# Patient Record
Sex: Female | Born: 1990 | Race: Black or African American | Hispanic: No | Marital: Single | Smoking: Never smoker
Health system: Southern US, Community
[De-identification: ages and names within clinical notes are randomized; demographics above are authoritative.]

## PROBLEM LIST (undated history)

## (undated) ENCOUNTER — Inpatient Hospital Stay (HOSPITAL_COMMUNITY): Payer: Self-pay

## (undated) DIAGNOSIS — A749 Chlamydial infection, unspecified: Secondary | ICD-10-CM

## (undated) DIAGNOSIS — D649 Anemia, unspecified: Secondary | ICD-10-CM

## (undated) DIAGNOSIS — IMO0002 Reserved for concepts with insufficient information to code with codable children: Secondary | ICD-10-CM

## (undated) HISTORY — DX: Anemia, unspecified: D64.9

## (undated) HISTORY — DX: Chlamydial infection, unspecified: A74.9

## (undated) HISTORY — PX: LEEP: SHX91

## (undated) HISTORY — DX: Reserved for concepts with insufficient information to code with codable children: IMO0002

---
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
## 2008-05-08 DIAGNOSIS — IMO0002 Reserved for concepts with insufficient information to code with codable children: Secondary | ICD-10-CM

## 2008-05-08 DIAGNOSIS — R87619 Unspecified abnormal cytological findings in specimens from cervix uteri: Secondary | ICD-10-CM

## 2008-05-08 HISTORY — DX: Unspecified abnormal cytological findings in specimens from cervix uteri: R87.619

## 2008-05-08 HISTORY — DX: Reserved for concepts with insufficient information to code with codable children: IMO0002

## 2011-01-02 LAB — ABO/RH: RH Type: POSITIVE

## 2011-01-02 LAB — VARICELLA ZOSTER ANTIBODY, IGG: Varicella: IMMUNE

## 2011-01-02 LAB — CBC
HCT: 38 % (ref 36–46)
Hemoglobin: 11.9 g/dL — AB (ref 12.0–16.0)

## 2011-01-02 LAB — GC/CHLAMYDIA PROBE AMP, GENITAL: Chlamydia: NEGATIVE

## 2011-05-09 NOTE — L&D Delivery Note (Cosign Needed)
Delivery Note At 8:15 PM a viable female was delivered via Vaginal, Spontaneous Delivery (Presentation:OA ;  ).  APGAR: 8, 9; weight .  Not able to obtain. Nursing staff to enter in chart 1hr after birth Placenta status: Intact, Spontaneous.  Cord: 3 vessels with the following complications: Long, Hyperspiraled.  Cord pH: NA.   Nuchal cord x2. Easily reducible  Anesthesia: Epidural  Episiotomy: None Lacerations: None Est. Blood Loss (mL): 400  Mom to postpartum.  Baby to nursery-stable.  Areej Tayler 08/22/2011, 8:44 PM

## 2011-05-15 LAB — GLUCOSE TOLERANCE, 1 HOUR: GTT, 1 hr: 114

## 2011-05-17 LAB — GC/CHLAMYDIA PROBE AMP, GENITAL
Chlamydia: NEGATIVE
Gonorrhea: NEGATIVE

## 2011-06-15 DIAGNOSIS — Z9889 Other specified postprocedural states: Secondary | ICD-10-CM

## 2011-06-15 DIAGNOSIS — O3440 Maternal care for other abnormalities of cervix, unspecified trimester: Secondary | ICD-10-CM

## 2011-06-15 NOTE — Progress Notes (Signed)
Chart abstracted- per chart patient had psychosocial screening  01/02/11, and depression screening 05/25/11 showing patient reports feeling depressed  With little pleasure in doing things, also had nutrition screening, needs pap repeated due to insufficient specimen

## 2011-06-19 ENCOUNTER — Other Ambulatory Visit (HOSPITAL_COMMUNITY)
Admission: RE | Admit: 2011-06-19 | Discharge: 2011-06-19 | Disposition: A | Discharge status: Home / Self Care | Payer: Medicaid Other | Source: Ambulatory Visit | Attending: Obstetrics and Gynecology | Admitting: Obstetrics and Gynecology

## 2011-06-19 ENCOUNTER — Ambulatory Visit (INDEPENDENT_AMBULATORY_CARE_PROVIDER_SITE_OTHER): Payer: Medicaid Other | Admitting: Obstetrics and Gynecology

## 2011-06-19 DIAGNOSIS — O09899 Supervision of other high risk pregnancies, unspecified trimester: Secondary | ICD-10-CM

## 2011-06-19 DIAGNOSIS — Z01419 Encounter for gynecological examination (general) (routine) without abnormal findings: Secondary | ICD-10-CM | POA: Insufficient documentation

## 2011-06-19 DIAGNOSIS — Z3480 Encounter for supervision of other normal pregnancy, unspecified trimester: Secondary | ICD-10-CM

## 2011-06-19 LAB — CBC
MCH: 27.1 pg (ref 26.0–34.0)
MCHC: 32.7 g/dL (ref 30.0–36.0)
Platelets: 139 10*3/uL — ABNORMAL LOW (ref 150–400)
RDW: 13.1 % (ref 11.5–15.5)

## 2011-06-19 LAB — POCT URINALYSIS DIP (DEVICE)
Leukocytes, UA: NEGATIVE
Nitrite: NEGATIVE
Protein, ur: 30 mg/dL — AB
Urobilinogen, UA: 1 mg/dL (ref 0.0–1.0)
pH: 6 (ref 5.0–8.0)

## 2011-06-19 NOTE — Progress Notes (Signed)
U/S scheduled 06/23/11 at 3pm.

## 2011-06-19 NOTE — Progress Notes (Signed)
Patient doing well without complaints. Recently relocated to [place]. Patient had uncomplicated first pregnancy and thus far does not have any medical/obstetrical conditions that warrant a presence in high risk clinic. Will schedule anatomy ultrasound (as ultrasounds on records do not document results of anatomy u/s), Will repeat pap smear today. Patient to meet with social worker and nutritionist and will return in 2 weeks to low risk clinic. FM/PTL precautions reviewed.

## 2011-06-19 NOTE — Progress Notes (Signed)
Pt had 1hr gtt at health dept on 05/15/11 result 114. Needs CBC, RPR and HIV. Needs repeat pap.

## 2011-06-20 LAB — RPR

## 2011-06-23 ENCOUNTER — Ambulatory Visit (HOSPITAL_COMMUNITY)
Admission: RE | Admit: 2011-06-23 | Discharge: 2011-06-23 | Disposition: A | Discharge status: Home / Self Care | Payer: Medicaid Other | Source: Ambulatory Visit | Attending: Obstetrics and Gynecology | Admitting: Obstetrics and Gynecology

## 2011-06-23 DIAGNOSIS — Z1389 Encounter for screening for other disorder: Secondary | ICD-10-CM | POA: Insufficient documentation

## 2011-06-23 DIAGNOSIS — O358XX0 Maternal care for other (suspected) fetal abnormality and damage, not applicable or unspecified: Secondary | ICD-10-CM | POA: Insufficient documentation

## 2011-06-23 DIAGNOSIS — Z363 Encounter for antenatal screening for malformations: Secondary | ICD-10-CM | POA: Insufficient documentation

## 2011-07-04 ENCOUNTER — Emergency Department (HOSPITAL_COMMUNITY)
Admission: EM | Admit: 2011-07-04 | Discharge: 2011-07-04 | Disposition: A | Discharge status: Home / Self Care | Payer: Medicaid Other | Attending: Emergency Medicine | Admitting: Emergency Medicine

## 2011-07-04 ENCOUNTER — Encounter (HOSPITAL_COMMUNITY): Payer: Self-pay | Admitting: Emergency Medicine

## 2011-07-04 DIAGNOSIS — R42 Dizziness and giddiness: Secondary | ICD-10-CM | POA: Insufficient documentation

## 2011-07-04 DIAGNOSIS — N390 Urinary tract infection, site not specified: Secondary | ICD-10-CM | POA: Insufficient documentation

## 2011-07-04 DIAGNOSIS — J069 Acute upper respiratory infection, unspecified: Secondary | ICD-10-CM | POA: Insufficient documentation

## 2011-07-04 DIAGNOSIS — O99891 Other specified diseases and conditions complicating pregnancy: Secondary | ICD-10-CM | POA: Insufficient documentation

## 2011-07-04 DIAGNOSIS — O2390 Unspecified genitourinary tract infection in pregnancy, unspecified trimester: Secondary | ICD-10-CM | POA: Insufficient documentation

## 2011-07-04 LAB — URINALYSIS, ROUTINE W REFLEX MICROSCOPIC
Ketones, ur: NEGATIVE mg/dL
Nitrite: NEGATIVE
Protein, ur: NEGATIVE mg/dL
Urobilinogen, UA: 0.2 mg/dL (ref 0.0–1.0)

## 2011-07-04 MED ORDER — NITROFURANTOIN MONOHYD MACRO 100 MG PO CAPS: 100.0000 mg | ORAL_CAPSULE | Freq: Two times a day (BID) | ORAL | Status: AC

## 2011-07-04 MED ORDER — AZITHROMYCIN 250 MG PO TABS: 500.0000 mg | ORAL_TABLET | Freq: Once | ORAL | Status: AC

## 2011-07-04 MED ORDER — ONDANSETRON 8 MG PO TBDP
8.0000 mg | ORAL_TABLET | Freq: Once | ORAL | Status: AC
  Administered 2011-07-04: 8 mg via ORAL
  Filled 2011-07-04: qty 1

## 2011-07-04 MED ORDER — ONDANSETRON 8 MG PO TBDP: 8.0000 mg | ORAL_TABLET | Freq: Once | ORAL | Status: AC

## 2011-07-04 NOTE — Discharge Instructions (Signed)
Upper Respiratory Infection, Adult An upper respiratory infection (URI) is also sometimes known as the common cold. The upper respiratory tract includes the nose, sinuses, throat, trachea, and bronchi. Bronchi are the airways leading to the lungs. Most people improve within 1 week, but symptoms can last up to 2 weeks. A residual cough may last even longer.  CAUSES Many different viruses can infect the tissues lining the upper respiratory tract. The tissues become irritated and inflamed and often become very moist. Mucus production is also common. A cold is contagious. You can easily spread the virus to others by oral contact. This includes kissing, sharing a glass, coughing, or sneezing. Touching your mouth or nose and then touching a surface, which is then touched by another person, can also spread the virus. SYMPTOMS  Symptoms typically develop 1 to 3 days after you come in contact with a cold virus. Symptoms vary from person to person. They may include:  Runny nose.   Sneezing.   Nasal congestion.   Sinus irritation.   Sore throat.   Loss of voice (laryngitis).   Cough.   Fatigue.   Muscle aches.   Loss of appetite.   Headache.   Low-grade fever.  DIAGNOSIS  You might diagnose your own cold based on familiar symptoms, since most people get a cold 2 to 3 times a year. Your caregiver can confirm this based on your exam. Most importantly, your caregiver can check that your symptoms are not due to another disease such as strep throat, sinusitis, pneumonia, asthma, or epiglottitis. Blood tests, throat tests, and X-rays are not necessary to diagnose a common cold, but they may sometimes be helpful in excluding other more serious diseases. Your caregiver will decide if any further tests are required. RISKS AND COMPLICATIONS  You may be at risk for a more severe case of the common cold if you smoke cigarettes, have chronic heart disease (such as heart failure) or lung disease (such as  asthma), or if you have a weakened immune system. The very young and very old are also at risk for more serious infections. Bacterial sinusitis, middle ear infections, and bacterial pneumonia can complicate the common cold. The common cold can worsen asthma and chronic obstructive pulmonary disease (COPD). Sometimes, these complications can require emergency medical care and may be life-threatening. PREVENTION  The best way to protect against getting a cold is to practice good hygiene. Avoid oral or hand contact with people with cold symptoms. Wash your hands often if contact occurs. There is no clear evidence that vitamin C, vitamin E, echinacea, or exercise reduces the chance of developing a cold. However, it is always recommended to get plenty of rest and practice good nutrition. TREATMENT  Treatment is directed at relieving symptoms. There is no cure. Antibiotics are not effective, because the infection is caused by a virus, not by bacteria. Treatment may include:  Increased fluid intake. Sports drinks offer valuable electrolytes, sugars, and fluids.   Breathing heated mist or steam (vaporizer or shower).   Eating chicken soup or other clear broths, and maintaining good nutrition.   Getting plenty of rest.   Using gargles or lozenges for comfort.   Controlling fevers with ibuprofen or acetaminophen as directed by your caregiver.   Increasing usage of your inhaler if you have asthma.  Zinc gel and zinc lozenges, taken in the first 24 hours of the common cold, can shorten the duration and lessen the severity of symptoms. Pain medicines may help with fever, muscle   aches, and throat pain. A variety of non-prescription medicines are available to treat congestion and runny nose. Your caregiver can make recommendations and may suggest nasal or lung inhalers for other symptoms.  HOME CARE INSTRUCTIONS   Only take over-the-counter or prescription medicines for pain, discomfort, or fever as directed  by your caregiver.   Use a warm mist humidifier or inhale steam from a shower to increase air moisture. This may keep secretions moist and make it easier to breathe.   Drink enough water and fluids to keep your urine clear or pale yellow.   Rest as needed.   Return to work when your temperature has returned to normal or as your caregiver advises. You may need to stay home longer to avoid infecting others. You can also use a face mask and careful hand washing to prevent spread of the virus.  SEEK MEDICAL CARE IF:   After the first few days, you feel you are getting worse rather than better.   You need your caregiver's advice about medicines to control symptoms.   You develop chills, worsening shortness of breath, or brown or red sputum. These may be signs of pneumonia.   You develop yellow or brown nasal discharge or pain in the face, especially when you bend forward. These may be signs of sinusitis.   You develop a fever, swollen neck glands, pain with swallowing, or white areas in the back of your throat. These may be signs of strep throat.  SEEK IMMEDIATE MEDICAL CARE IF:   You have a fever.   You develop severe or persistent headache, ear pain, sinus pain, or chest pain.   You develop wheezing, a prolonged cough, cough up blood, or have a change in your usual mucus (if you have chronic lung disease).   You develop sore muscles or a stiff neck.  Document Released: 10/18/2000 Document Revised: 01/04/2011 Document Reviewed: 08/26/2010 [facility] Patient Information 2012 [place], [place].Urinary Tract Infection A urinary tract infection (UTI) is often caused by a germ (bacteria). A UTI is usually helped with medicine (antibiotics) that kills germs. Take all the medicine until it is gone. Do this even if you are feeling better. You are usually better in 7 to 10 days. HOME CARE   Drink enough water and fluids to keep your pee (urine) clear or pale yellow. Drink:   Cranberry juice.    Water.   Avoid:   Caffeine.   Tea.   Bubbly (carbonated) drinks.   Alcohol.   Only take medicine as told by your doctor.   To prevent further infections:   Pee often.   After pooping (bowel movement), women should wipe from front to back. Use each tissue only once.   Pee before and after having sex (intercourse).  Ask your doctor when your test results will be ready. Make sure you follow up and get your test results.  GET HELP RIGHT AWAY IF:   There is very bad back pain or lower belly (abdominal) pain.   You get the chills.   You have a fever.   Your baby is older than 3 months with a rectal temperature of 102 F (38.9 C) or higher.   Your baby is 50 months old or younger with a rectal temperature of 100.4 F (38 C) or higher.   You feel sick to your stomach (nauseous) or throw up (vomit).   There is continued burning with peeing.   Your problems are not better in 3 days. Return sooner if  you are getting worse.  MAKE SURE YOU:   Understand these instructions.   Will watch your condition.   Will get help right away if you are not doing well or get worse.  Document Released: 10/11/2007 Document Revised: 01/04/2011 Document Reviewed: 10/11/2007 [facility] Patient Information 2012 [place], [place].

## 2011-07-04 NOTE — ED Notes (Signed)
OB nurse  Victorino Dike in ER

## 2011-07-04 NOTE — ED Notes (Signed)
Pt alert, nad, c/o dizziness, onset approx 1 hr ago, pt ambulates to triage, steady gait noted, pt [redacted] wks gestation, denies changes in fetal activity

## 2011-07-04 NOTE — ED Notes (Signed)
Pt d/c'd home with instructions, denies questions, verbalized understanding

## 2011-07-04 NOTE — ED Provider Notes (Signed)
21 year old female at [redacted] weeks gestation who presents with a complaint of persistent coughing, dizziness and nausea. The cough has been going on for approximately one week, it seems to be productive of phlegm as does her nasal drainage.  Physical exam:  Abdomen appropriate for dates, nontender, lungs clear, heart regular rate and rhythm without murmurs, oropharynx with mild erythema, tympanic membranes clear bilaterally, clear nasal rhinorrhea present  Assessment:  Patient appears to have upper respiratory infection along with nausea. Urinalysis ordered, fetal heart tones normal, urinalysis consistent with asymptomatic bacteriuria.  Antibiotics prescribed  Results for orders placed during the hospital encounter of 07/04/11  URINALYSIS, ROUTINE W REFLEX MICROSCOPIC      Component Value Range   Color, Urine YELLOW  YELLOW    APPearance CLEAR  CLEAR    Specific Gravity, Urine 1.024  1.005 - 1.030    pH 7.0  5.0 - 8.0    Glucose, UA NEGATIVE  NEGATIVE (mg/dL)   Hgb urine dipstick NEGATIVE  NEGATIVE    Bilirubin Urine NEGATIVE  NEGATIVE    Ketones, ur NEGATIVE  NEGATIVE (mg/dL)   Protein, ur NEGATIVE  NEGATIVE (mg/dL)   Urobilinogen, UA 0.2  0.0 - 1.0 (mg/dL)   Nitrite NEGATIVE  NEGATIVE    Leukocytes, UA SMALL (*) NEGATIVE   URINE MICROSCOPIC-ADD ON      Component Value Range   Squamous Epithelial / LPF FEW (*) RARE    WBC, UA 3-6  <3 (WBC/hpf)   Bacteria, UA MANY (*) RARE    Urine-Other MUCOUS PRESENT     Medical screening examination/treatment/procedure(s) were conducted as a shared visit with non-physician practitioner(s) and myself.  I personally evaluated the patient during the encounter    Vida Roller, MD 07/04/11 [id number]

## 2011-07-04 NOTE — ED Provider Notes (Signed)
History     CSN: [id number]  Arrival date & time 07/04/11  0415   First MD Initiated Contact with Patient 07/04/11 [phone]      Chief Complaint  Patient presents with  . Dizziness  . [redacted] Weeks Pregnant     (Consider location/radiation/quality/duration/timing/severity/associated sxs/prior treatment) HPI Comments: Patient who is [redacted] weeks pregnant with second child and no prenatal complications presents with acute onset of dizziness that started about one hour ago. States that she awoke and felt like the room was spinning, and nausea. Reports that the dizziness has now stopped, but remains with nausea. Reports one-week history of nasal congestion slight cough and ear fullness. Denies fevers chills. States has felt baby move, denies gush of fluid or vaginal bleeding. Denies back pain abdominal pain or cramping.  Patient is a 21 y.o. female presenting with neurologic complaint. The history is provided by the patient. No language interpreter was used.  Neurologic Problem The primary symptoms include dizziness and nausea. Primary symptoms do not include headaches, syncope, loss of consciousness, altered mental status, seizures, visual change, paresthesias, focal weakness, loss of sensation, speech change, memory loss, fever or vomiting. The symptoms began 1 to 2 hours ago. The episode lasted 30 minutes. The symptoms are improving. The neurological symptoms are diffuse. The symptoms occurred during pregnancy.  Dizziness also occurs with nausea. Dizziness does not occur with tinnitus, vomiting or weakness.  Additional symptoms include vertigo. Additional symptoms do not include neck stiffness, weakness, pain, lower back pain, leg pain, loss of balance, photophobia, aura, hallucinations, nystagmus, taste disturbance, hyperacusis, hearing loss, tinnitus, anxiety, irritability or dysphoric mood.    Past Medical History  Diagnosis Date  . Anemia   . Abnormal Pap smear 2010    had LEEP  . Chlamydia      Past Surgical History  Procedure Date  . Leep     Family History  Problem Relation Age of Onset  . Hypertension Maternal Grandmother   . Diabetes Paternal Grandmother     History  Substance Use Topics  . Smoking status: Not on file  . Smokeless tobacco: Not on file  . Alcohol Use: Not on file    OB History    Grav Para Term Preterm Abortions TAB SAB Ect Mult Living   2 1 1              Review of Systems  Constitutional: Negative for fever and irritability.  HENT: Negative for hearing loss, neck stiffness and tinnitus.   Eyes: Negative for photophobia.  Cardiovascular: Negative for syncope.  Gastrointestinal: Positive for nausea. Negative for vomiting.  Neurological: Positive for dizziness and vertigo. Negative for speech change, focal weakness, seizures, loss of consciousness, weakness, headaches, paresthesias and loss of balance.  Psychiatric/Behavioral: Negative for hallucinations, memory loss, dysphoric mood and altered mental status.  All other systems reviewed and are negative.    Allergies  Review of patient's allergies indicates no known allergies.  Home Medications   Current Outpatient Rx  Name Route Sig Dispense Refill  . ACETAMINOPHEN 325 MG PO TABS Oral Take 650 mg by mouth every 6 (six) hours as needed.    Marland Kitchen PRENATAL VITAMINS PLUS 27-1 MG PO TABS Oral Take 1 tablet by mouth.      BP 126/75  Pulse 89  Temp(Src) 99.1 F (37.3 C) (Oral)  Resp 20  SpO2 100%  LMP 11/08/2010  Physical Exam  Nursing note and vitals reviewed. Constitutional: She is oriented to person, place, and time. She appears well-developed  and well-nourished. No distress.  HENT:  Head: Normocephalic and atraumatic.  Right Ear: External ear normal.  Left Ear: External ear normal.  Mouth/Throat: Oropharynx is clear and moist. No oropharyngeal exudate.       Boggy nasal mucosa  Eyes: Conjunctivae are normal. Pupils are equal, round, and reactive to light. No scleral icterus.        No nystagmus  Neck: Normal range of motion. Neck supple.  Cardiovascular: Normal rate, regular rhythm and normal heart sounds.  Exam reveals no gallop and no friction rub.   No murmur heard. Pulmonary/Chest: Effort normal and breath sounds normal. No respiratory distress. She has no wheezes. She has no rales. She exhibits no tenderness.  Abdominal: Soft. Bowel sounds are normal. She exhibits no distension. There is no tenderness.       Gravid  Genitourinary:       Gravid uterus  Musculoskeletal: Normal range of motion. She exhibits no edema and no tenderness.  Lymphadenopathy:    She has no cervical adenopathy.  Neurological: She is alert and oriented to person, place, and time. No cranial nerve deficit. She exhibits normal muscle tone. Coordination normal.  Skin: Skin is warm and dry. No rash noted. No erythema. No pallor.  Psychiatric: She has a normal mood and affect. Her behavior is normal. Judgment and thought content normal.    ED Course  Procedures (including critical care time)   Labs Reviewed  URINALYSIS, ROUTINE W REFLEX MICROSCOPIC   No results found. Results for orders placed during the hospital encounter of 07/04/11  URINALYSIS, ROUTINE W REFLEX MICROSCOPIC      Component Value Range   Color, Urine YELLOW  YELLOW    APPearance CLEAR  CLEAR    Specific Gravity, Urine 1.024  1.005 - 1.030    pH 7.0  5.0 - 8.0    Glucose, UA NEGATIVE  NEGATIVE (mg/dL)   Hgb urine dipstick NEGATIVE  NEGATIVE    Bilirubin Urine NEGATIVE  NEGATIVE    Ketones, ur NEGATIVE  NEGATIVE (mg/dL)   Protein, ur NEGATIVE  NEGATIVE (mg/dL)   Urobilinogen, UA 0.2  0.0 - 1.0 (mg/dL)   Nitrite NEGATIVE  NEGATIVE    Leukocytes, UA SMALL (*) NEGATIVE   URINE MICROSCOPIC-ADD ON      Component Value Range   Squamous Epithelial / LPF FEW (*) RARE    WBC, UA 3-6  <3 (WBC/hpf)   Bacteria, UA MANY (*) RARE    Urine-Other MUCOUS PRESENT     US Ob Detail + 14 Wk  06/23/2011  OBSTETRICAL  ULTRASOUND: This exam was performed within a [facility]. The OB US report was generated in the [facility] system, and faxed to the ordering physician.   This report is also available in [employer] and in the [employer]. See [facility] Obstetric US report.      URI UTI    MDM  Patient with urinary tract infection and URI as well - will place on antibiotics for this - no fever or chills, FHT re-assuring - seen by Dr. Hyacinth Meeker as well.        Izola Price [place], [place] 07/04/11 [phone]

## 2011-07-05 ENCOUNTER — Ambulatory Visit (INDEPENDENT_AMBULATORY_CARE_PROVIDER_SITE_OTHER): Payer: Medicaid Other | Admitting: Obstetrics and Gynecology

## 2011-07-05 ENCOUNTER — Encounter: Payer: Self-pay | Admitting: Obstetrics and Gynecology

## 2011-07-05 VITALS — BP 109/74 | Temp 97.9°F | Wt 195.1 lb

## 2011-07-05 DIAGNOSIS — O3440 Maternal care for other abnormalities of cervix, unspecified trimester: Secondary | ICD-10-CM

## 2011-07-05 DIAGNOSIS — O09899 Supervision of other high risk pregnancies, unspecified trimester: Secondary | ICD-10-CM

## 2011-07-05 DIAGNOSIS — Z3480 Encounter for supervision of other normal pregnancy, unspecified trimester: Secondary | ICD-10-CM

## 2011-07-05 LAB — POCT URINALYSIS DIP (DEVICE)
Ketones, ur: NEGATIVE mg/dL
Protein, ur: NEGATIVE mg/dL
Specific Gravity, Urine: 1.025 (ref 1.005–1.030)
pH: 7 (ref 5.0–8.0)

## 2011-07-05 MED ORDER — PRENATAL VITAMINS PLUS 27-1 MG PO TABS: 1.0000 | ORAL_TABLET | Freq: Every day | ORAL | Status: DC

## 2011-07-05 NOTE — Patient Instructions (Signed)
Pregnancy - Third Trimester The third trimester of pregnancy (the last 3 months) is a period of the most rapid growth for you and your baby. The baby approaches a length of 20 inches and a weight of 6 to 10 pounds. The baby is adding on fat and getting ready for life outside your body. While inside, babies have periods of sleeping and waking, suck their thumbs, and hiccups. You can often feel small contractions of the uterus. This is false labor. It is also called Braxton-Hicks contractions. This is like a practice for labor. The usual problems in this stage of pregnancy include more difficulty breathing, swelling of the hands and feet from water retention, and having to urinate more often because of the uterus and baby pressing on your bladder.  PRENATAL EXAMS  Blood work may continue to be done during prenatal exams. These tests are done to check on your health and the probable health of your baby. Blood work is used to follow your blood levels (hemoglobin). Anemia (low hemoglobin) is common during pregnancy. Iron and vitamins are given to help prevent this. You may also continue to be checked for diabetes. Some of the past blood tests may be done again.   The size of the uterus is measured during each visit. This makes sure your baby is growing properly according to your pregnancy dates.   Your blood pressure is checked every prenatal visit. This is to make sure you are not getting toxemia.   Your urine is checked every prenatal visit for infection, diabetes and protein.   Your weight is checked at each visit. This is done to make sure gains are happening at the suggested rate and that you and your baby are growing normally.   Sometimes, an ultrasound is performed to confirm the position and the proper growth and development of the baby. This is a test done that bounces harmless sound waves off the baby so your caregiver can more accurately determine due dates.   Discuss the type of pain  medication and anesthesia you will have during your labor and delivery.   Discuss the possibility and anesthesia if a Cesarean Section might be necessary.   Inform your caregiver if there is any mental or physical violence at home.  Sometimes, a specialized non-stress test, contraction stress test and biophysical profile are done to make sure the baby is not having a problem. Checking the amniotic fluid surrounding the baby is called an amniocentesis. The amniotic fluid is removed by sticking a needle into the belly (abdomen). This is sometimes done near the end of pregnancy if an early delivery is required. In this case, it is done to help make sure the baby's lungs are mature enough for the baby to live outside of the womb. If the lungs are not mature and it is unsafe to deliver the baby, an injection of cortisone medication is given to the mother 1 to 2 days before the delivery. This helps the baby's lungs mature and makes it safer to deliver the baby. CHANGES OCCURING IN THE THIRD TRIMESTER OF PREGNANCY Your body goes through many changes during pregnancy. They vary from person to person. Talk to your caregiver about changes you notice and are concerned about.  During the last trimester, you have probably had an increase in your appetite. It is normal to have cravings for certain foods. This varies from person to person and pregnancy to pregnancy.   You may begin to get stretch marks on your hips,   abdomen, and breasts. These are normal changes in the body during pregnancy. There are no exercises or medications to take which prevent this change.   Constipation may be treated with a stool softener or adding bulk to your diet. Drinking lots of fluids, fiber in vegetables, fruits, and whole grains are helpful.   Exercising is also helpful. If you have been very active up until your pregnancy, most of these activities can be continued during your pregnancy. If you have been less active, it is helpful  to start an exercise program such as walking. Consult your caregiver before starting exercise programs.   Avoid all smoking, alcohol, un-prescribed drugs, herbs and "street drugs" during your pregnancy. These chemicals affect the formation and growth of the baby. Avoid chemicals throughout the pregnancy to ensure the delivery of a healthy infant.   Backache, varicose veins and hemorrhoids may develop or get worse.   You will tire more easily in the third trimester, which is normal.   The baby's movements may be stronger and more often.   You may become short of breath easily.   Your belly button may stick out.   A yellow discharge may leak from your breasts called colostrum.   You may have a bloody mucus discharge. This usually occurs a few days to a week before labor begins.  HOME CARE INSTRUCTIONS   Keep your caregiver's appointments. Follow your caregiver's instructions regarding medication use, exercise, and diet.   During pregnancy, you are providing food for you and your baby. Continue to eat regular, well-balanced meals. Choose foods such as meat, fish, milk and other low fat dairy products, vegetables, fruits, and whole-grain breads and cereals. Your caregiver will tell you of the ideal weight gain.   A physical sexual relationship may be continued throughout pregnancy if there are no other problems such as early (premature) leaking of amniotic fluid from the membranes, vaginal bleeding, or belly (abdominal) pain.   Exercise regularly if there are no restrictions. Check with your caregiver if you are unsure of the safety of your exercises. Greater weight gain will occur in the last 2 trimesters of pregnancy. Exercising helps:   Control your weight.   Get you in shape for labor and delivery.   You lose weight after you deliver.   Rest a lot with legs elevated, or as needed for leg cramps or low back pain.   Wear a good support or jogging bra for breast tenderness during  pregnancy. This may help if worn during sleep. Pads or tissues may be used in the bra if you are leaking colostrum.   Do not use hot tubs, steam rooms, or saunas.   Wear your seat belt when driving. This protects you and your baby if you are in an accident.   Avoid raw meat, cat litter boxes and soil used by cats. These carry germs that can cause birth defects in the baby.   It is easier to loose urine during pregnancy. Tightening up and strengthening the pelvic muscles will help with this problem. You can practice stopping your urination while you are going to the bathroom. These are the same muscles you need to strengthen. It is also the muscles you would use if you were trying to stop from passing gas. You can practice tightening these muscles up 10 times a set and repeating this about 3 times per day. Once you know what muscles to tighten up, do not perform these exercises during urination. It is more likely   to cause an infection by backing up the urine.   Ask for help if you have financial, counseling or nutritional needs during pregnancy. Your caregiver will be able to offer counseling for these needs as well as refer you for other special needs.   Make a list of emergency phone numbers and have them available.   Plan on getting help from family or friends when you go home from the hospital.   Make a trial run to the hospital.   Take prenatal classes with the father to understand, practice and ask questions about the labor and delivery.   Prepare the baby's room/nursery.   Do not travel out of the city unless it is absolutely necessary and with the advice of your caregiver.   Wear only low or no heal shoes to have better balance and prevent falling.  MEDICATIONS AND DRUG USE IN PREGNANCY  Take prenatal vitamins as directed. The vitamin should contain 1 milligram of folic acid. Keep all vitamins out of reach of children. Only a couple vitamins or tablets containing iron may be fatal  to a baby or young child when ingested.   Avoid use of all medications, including herbs, over-the-counter medications, not prescribed or suggested by your caregiver. Only take over-the-counter or prescription medicines for pain, discomfort, or fever as directed by your caregiver. Do not use aspirin, ibuprofen (Motrin, Advil, Nuprin) or naproxen (Aleve) unless OK'd by your caregiver.   Let your caregiver also know about herbs you may be using.   Alcohol is related to a number of birth defects. This includes fetal alcohol syndrome. All alcohol, in any form, should be avoided completely. Smoking will cause low birth rate and premature babies.   Street/illegal drugs are very harmful to the baby. They are absolutely forbidden. A baby born to an addicted mother will be addicted at birth. The baby will go through the same withdrawal an adult does.  SEEK MEDICAL CARE IF: You have any concerns or worries during your pregnancy. It is better to call with your questions if you feel they cannot wait, rather than worry about them. DECISIONS ABOUT CIRCUMCISION You may or may not know the sex of your baby. If you know your baby is a boy, it may be time to think about circumcision. Circumcision is the removal of the foreskin of the penis. This is the skin that covers the sensitive end of the penis. There is no proven medical need for this. Often this decision is made on what is popular at the time or based upon religious beliefs and social issues. You can discuss these issues with your caregiver or pediatrician. SEEK IMMEDIATE MEDICAL CARE IF:   An unexplained oral temperature above 102 F (38.9 C) develops, or as your caregiver suggests.   You have leaking of fluid from the vagina (birth canal). If leaking membranes are suspected, take your temperature and tell your caregiver of this when you call.   There is vaginal spotting, bleeding or passing clots. Tell your caregiver of the amount and how many pads are  used.   You develop a bad smelling vaginal discharge with a change in the color from clear to white.   You develop vomiting that lasts more than 24 hours.   You develop chills or fever.   You develop shortness of breath.   You develop burning on urination.   You loose more than 2 pounds of weight or gain more than 2 pounds of weight or as suggested by your   caregiver.   You notice sudden swelling of your face, hands, and feet or legs.   You develop belly (abdominal) pain. Round ligament discomfort is a common non-cancerous (benign) cause of abdominal pain in pregnancy. Your caregiver still must evaluate you.   You develop a severe headache that does not go away.   You develop visual problems, blurred or double vision.   If you have not felt your baby move for more than 1 hour. If you think the baby is not moving as much as usual, eat something with sugar in it and lie down on your left side for an hour. The baby should move at least 4 to 5 times per hour. Call right away if your baby moves less than that.   You fall, are in a car accident or any kind of trauma.   There is mental or physical violence at home.  Document Released: 04/18/2001 Document Revised: 01/04/2011 Document Reviewed: 10/21/2008 [employer] Patient Information 2012 [employer]. 

## 2011-07-05 NOTE — Progress Notes (Signed)
Pulse 104 Currently being treated for uti, and sinus infection.

## 2011-07-05 NOTE — Progress Notes (Signed)
No UTI sx. No C&S was done on UA at [facility]. Will cont MCN.

## 2011-07-06 ENCOUNTER — Encounter: Payer: Self-pay | Admitting: *Deleted

## 2011-07-06 DIAGNOSIS — O3440 Maternal care for other abnormalities of cervix, unspecified trimester: Secondary | ICD-10-CM | POA: Insufficient documentation

## 2011-07-19 ENCOUNTER — Ambulatory Visit (INDEPENDENT_AMBULATORY_CARE_PROVIDER_SITE_OTHER): Payer: Medicaid Other | Admitting: Family Medicine

## 2011-07-19 VITALS — BP 119/76 | Temp 97.2°F | Wt 194.8 lb

## 2011-07-19 DIAGNOSIS — Z3480 Encounter for supervision of other normal pregnancy, unspecified trimester: Secondary | ICD-10-CM

## 2011-07-19 DIAGNOSIS — O3440 Maternal care for other abnormalities of cervix, unspecified trimester: Secondary | ICD-10-CM

## 2011-07-19 LAB — POCT URINALYSIS DIP (DEVICE)
Glucose, UA: NEGATIVE mg/dL
Hgb urine dipstick: NEGATIVE
Protein, ur: NEGATIVE mg/dL
Specific Gravity, Urine: 1.025 (ref 1.005–1.030)
Urobilinogen, UA: 0.2 mg/dL (ref 0.0–1.0)

## 2011-07-19 NOTE — Progress Notes (Signed)
Pulse- 103.  Pain- "cramps"

## 2011-07-19 NOTE — Progress Notes (Signed)
S: Doing well, no concerns.  Denies vaginal bleeding, discharge, loss of fluid.  Occasional lower abdominal cramping.  O: vitals reviewed FH: 35cm FHT: 53  A/P: 21 year old G2P1001 at [redacted]w[redacted]d -GBS collected -labor precautions reviewed -follow up 1 week

## 2011-07-19 NOTE — Patient Instructions (Signed)

## 2011-07-20 LAB — GC/CHLAMYDIA PROBE AMP, URINE: GC Probe Amp, Urine: NEGATIVE

## 2011-07-20 NOTE — Progress Notes (Signed)
Chart review done.  Agree with resident note.   

## 2011-07-24 ENCOUNTER — Telehealth: Payer: Self-pay

## 2011-07-24 ENCOUNTER — Other Ambulatory Visit: Payer: Self-pay | Admitting: Family Medicine

## 2011-07-24 MED ORDER — CEPHALEXIN 500 MG PO CAPS: 500.0000 mg | ORAL_CAPSULE | Freq: Four times a day (QID) | ORAL | Status: DC

## 2011-07-24 NOTE — Telephone Encounter (Signed)
Message copied by Faythe Casa on Mon Jul 24, 2011  1:16 PM ------      Message from: Levie Heritage      Created: Mon Jul 24, 2011  8:29 AM       + UTI.  Keflex 500mg  four times daily sent to pharmacy.  Please inform patient

## 2011-07-24 NOTE — Telephone Encounter (Deleted)
Message copied by Faythe Casa on Mon Jul 24, 2011  1:32 PM ------      Message from: Levie Heritage      Created: Mon Jul 24, 2011  8:29 AM       + UTI.  Keflex 500mg  four times daily sent to pharmacy.  Please inform patient

## 2011-07-24 NOTE — Telephone Encounter (Signed)
Called pt and left message to return our call to the clinics.  

## 2011-07-25 ENCOUNTER — Other Ambulatory Visit: Payer: Self-pay | Admitting: Family Medicine

## 2011-07-25 LAB — CULTURE, OB URINE: Colony Count: >=100000

## 2011-07-25 MED ORDER — CEPHALEXIN 500 MG PO CAPS: 500.0000 mg | ORAL_CAPSULE | Freq: Four times a day (QID) | ORAL | Status: DC

## 2011-07-25 NOTE — Telephone Encounter (Signed)
Called pt and left message that I was calling regarding test results and treatment information. Please call back to the nurse voice mail and leave message of when she can be reached.

## 2011-07-26 ENCOUNTER — Ambulatory Visit (INDEPENDENT_AMBULATORY_CARE_PROVIDER_SITE_OTHER): Payer: Medicaid Other | Admitting: Physician Assistant

## 2011-07-26 VITALS — BP 113/70 | Temp 97.3°F | Wt 197.5 lb

## 2011-07-26 DIAGNOSIS — O3440 Maternal care for other abnormalities of cervix, unspecified trimester: Secondary | ICD-10-CM

## 2011-07-26 DIAGNOSIS — Z3480 Encounter for supervision of other normal pregnancy, unspecified trimester: Secondary | ICD-10-CM

## 2011-07-26 LAB — POCT URINALYSIS DIP (DEVICE)
Bilirubin Urine: NEGATIVE
Nitrite: NEGATIVE
pH: 7 (ref 5.0–8.0)

## 2011-07-26 MED ORDER — CEPHALEXIN 500 MG PO CAPS: 500.0000 mg | ORAL_CAPSULE | Freq: Four times a day (QID) | ORAL | Status: AC

## 2011-07-26 MED ORDER — CEPHALEXIN 500 MG PO CAPS: 500.0000 mg | ORAL_CAPSULE | Freq: Four times a day (QID) | ORAL | Status: DC

## 2011-07-26 NOTE — Progress Notes (Signed)
Pulse: 99 

## 2011-07-26 NOTE — Progress Notes (Signed)
I have seen/examined this patient and agree with the student's assessment and plan. Rx sent to pharm for UTI

## 2011-07-26 NOTE — Patient Instructions (Signed)
Labor Induction  Most women go into labor on their own between 37 and 42 weeks of the pregnancy. When this does not happen or when there is a medical need, medicine or other methods may be used to induce labor. Labor induction causes a pregnant woman's uterus to contract. It also causes the cervix to soften (ripen), open (dilate), and thin out (efface). Usually, labor is not induced before 39 weeks of the pregnancy unless there is a problem with the baby or mother. Whether your labor will be induced depends on a number of factors, including the following:  The medical condition of you and the baby.   How many weeks along you are.   The status of baby's lung maturity.   The condition of the cervix.   The position of the baby.  REASONS FOR LABOR INDUCTION  The health of the baby or mother is at risk.   The pregnancy is overdue by 1 week or more.   The water breaks but labor does not start on its own.   The mother has a health condition or serious illness such as high blood pressure, infection, placental abruption, or diabetes.   The amniotic fluid amounts are low around the baby.   The baby is distressed.  REASONS TO NOT INDUCE LABOR Labor induction may not be a good idea if:  It is shown that your baby does not tolerate labor.   An induction is just more convenient.   You want the baby to be born on a certain date, like a holiday.   You have had previous surgeries on your uterus, such as a myomectomy or the removal of fibroids.   Your placenta lies very low in the uterus and blocks the opening of the cervix (placenta previa).   Your baby is not in a head down position.   The umbilical cord drops down into the birth canal in front of the baby. This could cut off the baby's blood and oxygen supply.   You have had a previous cesarean delivery.   There areunusual circumstances, such as the baby being extremely premature.  RISKS AND COMPLICATIONS Problems may occur in the  process of induction and plans may need to be modified as a situation unfolds. Some of the risks of induction include:  Change in fetal heart rate, such as too high, too low, or erratic.   Risk of fetal distress.   Risk of infection to mother and baby.   Increased chance of having a cesarean delivery.   The rare, but increased chance that the placenta will separate from the uterus (abruption).   Uterine rupture (very rare).  When induction is needed for medical reasons, the benefits of induction may outweigh the risks. BEFORE THE PROCEDURE Your caregiver will check your cervix and the baby's position. This will help your caregiver decide if you are far enough along for an induction to work. PROCEDURE Several methods of labor induction may be used, such as:   Taking prostaglandin medicine to dilate and ripen the cervix. The medicine will also start contractions. It can be taken by mouth or by inserting a suppository into the vagina.   A thin tube (catheter) with a balloon on the end may be inserted into your vagina to dilate the cervix. Once inserted, the balloon expands with water, which causes the cervix to open.   Striping the membranes. Your caregiver inserts a finger between the cervix and membranes, which causes the cervix to be stretched and   may cause the uterus to contract. This is often done during an office visit. You will be sent home to wait for the contractions to begin. You will then come in for an induction.   Breaking the water. Your caregiver will make a hole in the amniotic sac using a small instrument. Once the amniotic sac breaks, contractions should begin. This may still take hours to see an effect.   Taking medicine to trigger or strengthen contractions. This medicine is given intravenously through a tube in your arm.  All of the methods of induction, besides stripping the membranes, will be done in the hospital. Induction is done in the hospital so that you and the  baby can be carefully monitored. AFTER THE PROCEDURE Some inductions can take up to 2 or 3 days. Depending on the cervix, it usually takes less time. It takes longer when you are induced early in the pregnancy or if this is your first pregnancy. If a mother is still pregnant and the induction has been going on for 2 to 3 days, either the mother will be sent home or a cesarean delivery will be needed. Document Released: 09/13/2006 Document Revised: 04/13/2011 Document Reviewed: 02/27/2011 [employer] Patient Information 2012 [employer]. 

## 2011-07-26 NOTE — Progress Notes (Signed)
20yo G2P1000 at 37w 1d presenting for prenatal care with no complaints.  She has been feeling irregular, non timeable, mild CTX occasionally. She plans on breast feeding, having female circumcised, and OCPs for future family planning.  Patient had +UTI by Urine Cx on 07/19/11, she has not taken ABX since first UTI 3 weeks ago.  Currently not experiencing any urinary symptoms.  FHT-150, FH - 37.5, Cervix - 3/60/-2 vertex.  Will Rx another ABX due to recent Urine CX. Patient to follow up in 1 week.

## 2011-07-26 NOTE — Telephone Encounter (Signed)
Pt seen in clinic today. She was notified by Maylon Cos to pick up her prescription.

## 2011-07-30 ENCOUNTER — Encounter (HOSPITAL_COMMUNITY): Payer: Self-pay | Admitting: *Deleted

## 2011-07-30 ENCOUNTER — Inpatient Hospital Stay (HOSPITAL_COMMUNITY)
Admission: AD | Admit: 2011-07-30 | Discharge: 2011-07-30 | Disposition: A | Discharge status: Home / Self Care | Payer: Medicaid Other | Source: Ambulatory Visit | Attending: Obstetrics & Gynecology | Admitting: Obstetrics & Gynecology

## 2011-07-30 DIAGNOSIS — O479 False labor, unspecified: Secondary | ICD-10-CM | POA: Insufficient documentation

## 2011-07-30 DIAGNOSIS — O3440 Maternal care for other abnormalities of cervix, unspecified trimester: Secondary | ICD-10-CM

## 2011-07-30 NOTE — MAU Note (Signed)
About an hour ago had sharp pain in R side. Then thought I might be having some contractions.

## 2011-07-30 NOTE — Discharge Instructions (Signed)

## 2011-07-30 NOTE — MAU Provider Note (Signed)
History     CSN: [id number]  Arrival date and time: 07/30/11 0530   First Provider Initiated Contact with Patient 07/30/11 [phone]      No chief complaint on file.  HPI  Jodi Ryan is a 21 y.o. G2P1000 who presents at [redacted]w[redacted]d with sharp right sided lower quadrant/groin pain and contractions that started at 0415. Contractions were roughly every 3 minutes. It has since stopped, no pain currently here. No more contractions since arrival to MAU. + fetal movement. No bleeding, discharge or gush of fluid. [facility]. Last appt was 4 days ago, next appt is in 3 days. No complications with pregnancy.  Past Medical History  Diagnosis Date  . Anemia   . Abnormal Pap smear 2010    had LEEP  . Chlamydia     Past Surgical History  Procedure Date  . Leep     Family History  Problem Relation Age of Onset  . Hypertension Maternal Grandmother   . Diabetes Paternal Grandmother   . Hypotension Neg Hx   . Anesthesia problems Neg Hx   . Malignant hyperthermia Neg Hx   . Pseudochol deficiency Neg Hx     History  Substance Use Topics  . Smoking status: Never Smoker   . Smokeless tobacco: Not on file  . Alcohol Use: No    Allergies: No Known Allergies  Prescriptions prior to admission  Medication Sig Dispense Refill  . acetaminophen (TYLENOL) 325 MG tablet Take 650 mg by mouth every 6 (six) hours as needed.      . calcium carbonate (TUMS - DOSED IN MG ELEMENTAL CALCIUM) 500 MG chewable tablet Chew 1 tablet by mouth as needed. For heartburn      . cephALEXin (KEFLEX) 500 MG capsule Take 1 capsule (500 mg total) by mouth 4 (four) times daily.  28 capsule  0  . Prenatal Vit-Fe Fumarate-FA (PRENATAL VITAMINS PLUS) 27-1 MG TABS Take 1 tablet by mouth daily.  30 tablet  5    Review of Systems  Constitutional: Negative for fever.  Eyes: Negative for blurred vision, double vision and photophobia.  Respiratory: Negative for shortness of breath.   Cardiovascular:  Negative for chest pain.  Gastrointestinal: Positive for abdominal pain (RLQ/groin pain, none currently).  Genitourinary: Negative for hematuria.  Neurological: Negative for headaches.   Physical Exam   Blood pressure 122/71, pulse 114, temperature 98.4 F (36.9 C), temperature source Oral, resp. rate 20, height 5\' 6"  (1.676 m), weight 92.625 kg (204 lb 3.2 oz), last menstrual period 11/08/2010.  Physical Exam  Constitutional: She is oriented to person, place, and time. She appears well-developed and well-nourished. No distress.  HENT:  Head: Normocephalic and atraumatic.  Eyes: EOM are normal.  Neck: Normal range of motion.  Cardiovascular: Normal rate.   Respiratory: Effort normal. No respiratory distress.  GI: Soft. She exhibits distension (gravid). There is no tenderness.  Musculoskeletal: Normal range of motion. She exhibits no edema.  Neurological: She is alert and oriented to person, place, and time. No cranial nerve deficit.  Skin: Skin is warm and dry. She is not diaphoretic. No erythema.  Psychiatric: She has a normal mood and affect. Her behavior is normal. Judgment and thought content normal.    MAU Course  Procedures  Labor Check: Initial Dilation: 3 Effacement (%): 70 Cervical Position: Anterior Station: -2 Presentation: Vertex Exam by:: Quintella Baton RNC Recheck in 1 hr  FHR: 150 baseline, moderate variability, accels present, no decels Category 1  No contraction, some irritability  On recheck: Unchanged  Assessment and Plan  21 y.o. G2P1000 who presents at [redacted]w[redacted]d Labor check Likely Braxton-Hicks contractions Discharge home Advised on labor precautions  [name] 07/30/2011, 6:39 AM

## 2011-07-30 NOTE — Progress Notes (Signed)
Written and verbal d/c instructions given and understanding voiced. 

## 2011-07-30 NOTE — Progress Notes (Signed)
Dr Leonor Liv in discussing d/c plan

## 2011-07-30 NOTE — Progress Notes (Signed)
Threasa Heads CNM notified of pt's admission and status. CNM in del so RN to ck pt

## 2011-08-02 ENCOUNTER — Ambulatory Visit (INDEPENDENT_AMBULATORY_CARE_PROVIDER_SITE_OTHER): Payer: Medicaid Other | Admitting: Advanced Practice Midwife

## 2011-08-02 VITALS — Wt 198.5 lb

## 2011-08-02 DIAGNOSIS — Z3480 Encounter for supervision of other normal pregnancy, unspecified trimester: Secondary | ICD-10-CM

## 2011-08-02 DIAGNOSIS — O3440 Maternal care for other abnormalities of cervix, unspecified trimester: Secondary | ICD-10-CM

## 2011-08-02 LAB — POCT URINALYSIS DIP (DEVICE)
Nitrite: NEGATIVE
Protein, ur: NEGATIVE mg/dL
Urobilinogen, UA: 0.2 mg/dL (ref 0.0–1.0)

## 2011-08-02 NOTE — Progress Notes (Signed)
Pulse- 84.  "Contractions had started for some weeks"

## 2011-08-02 NOTE — Progress Notes (Signed)
Well, no c/o, ongoing irregular contractions, seen in [facility] 3 days ago for labor check with no cervical change. Rev'd precautions, f/u 1 week.

## 2011-08-02 NOTE — Patient Instructions (Signed)
Pregnancy - Third Trimester The third trimester of pregnancy (the last 3 months) is a period of the most rapid growth for you and your baby. The baby approaches a length of 20 inches and a weight of 6 to 10 pounds. The baby is adding on fat and getting ready for life outside your body. While inside, babies have periods of sleeping and waking, suck their thumbs, and hiccups. You can often feel small contractions of the uterus. This is false labor. It is also called Braxton-Hicks contractions. This is like a practice for labor. The usual problems in this stage of pregnancy include more difficulty breathing, swelling of the hands and feet from water retention, and having to urinate more often because of the uterus and baby pressing on your bladder.  PRENATAL EXAMS  Blood work may continue to be done during prenatal exams. These tests are done to check on your health and the probable health of your baby. Blood work is used to follow your blood levels (hemoglobin). Anemia (low hemoglobin) is common during pregnancy. Iron and vitamins are given to help prevent this. You may also continue to be checked for diabetes. Some of the past blood tests may be done again.   The size of the uterus is measured during each visit. This makes sure your baby is growing properly according to your pregnancy dates.   Your blood pressure is checked every prenatal visit. This is to make sure you are not getting toxemia.   Your urine is checked every prenatal visit for infection, diabetes and protein.   Your weight is checked at each visit. This is done to make sure gains are happening at the suggested rate and that you and your baby are growing normally.   Sometimes, an ultrasound is performed to confirm the position and the proper growth and development of the baby. This is a test done that bounces harmless sound waves off the baby so your caregiver can more accurately determine due dates.   Discuss the type of pain  medication and anesthesia you will have during your labor and delivery.   Discuss the possibility and anesthesia if a Cesarean Section might be necessary.   Inform your caregiver if there is any mental or physical violence at home.  Sometimes, a specialized non-stress test, contraction stress test and biophysical profile are done to make sure the baby is not having a problem. Checking the amniotic fluid surrounding the baby is called an amniocentesis. The amniotic fluid is removed by sticking a needle into the belly (abdomen). This is sometimes done near the end of pregnancy if an early delivery is required. In this case, it is done to help make sure the baby's lungs are mature enough for the baby to live outside of the womb. If the lungs are not mature and it is unsafe to deliver the baby, an injection of cortisone medication is given to the mother 1 to 2 days before the delivery. This helps the baby's lungs mature and makes it safer to deliver the baby. CHANGES OCCURING IN THE THIRD TRIMESTER OF PREGNANCY Your body goes through many changes during pregnancy. They vary from person to person. Talk to your caregiver about changes you notice and are concerned about.  During the last trimester, you have probably had an increase in your appetite. It is normal to have cravings for certain foods. This varies from person to person and pregnancy to pregnancy.   You may begin to get stretch marks on your hips,   abdomen, and breasts. These are normal changes in the body during pregnancy. There are no exercises or medications to take which prevent this change.   Constipation may be treated with a stool softener or adding bulk to your diet. Drinking lots of fluids, fiber in vegetables, fruits, and whole grains are helpful.   Exercising is also helpful. If you have been very active up until your pregnancy, most of these activities can be continued during your pregnancy. If you have been less active, it is helpful  to start an exercise program such as walking. Consult your caregiver before starting exercise programs.   Avoid all smoking, alcohol, un-prescribed drugs, herbs and "street drugs" during your pregnancy. These chemicals affect the formation and growth of the baby. Avoid chemicals throughout the pregnancy to ensure the delivery of a healthy infant.   Backache, varicose veins and hemorrhoids may develop or get worse.   You will tire more easily in the third trimester, which is normal.   The baby's movements may be stronger and more often.   You may become short of breath easily.   Your belly button may stick out.   A yellow discharge may leak from your breasts called colostrum.   You may have a bloody mucus discharge. This usually occurs a few days to a week before labor begins.  HOME CARE INSTRUCTIONS   Keep your caregiver's appointments. Follow your caregiver's instructions regarding medication use, exercise, and diet.   During pregnancy, you are providing food for you and your baby. Continue to eat regular, well-balanced meals. Choose foods such as meat, fish, milk and other low fat dairy products, vegetables, fruits, and whole-grain breads and cereals. Your caregiver will tell you of the ideal weight gain.   A physical sexual relationship may be continued throughout pregnancy if there are no other problems such as early (premature) leaking of amniotic fluid from the membranes, vaginal bleeding, or belly (abdominal) pain.   Exercise regularly if there are no restrictions. Check with your caregiver if you are unsure of the safety of your exercises. Greater weight gain will occur in the last 2 trimesters of pregnancy. Exercising helps:   Control your weight.   Get you in shape for labor and delivery.   You lose weight after you deliver.   Rest a lot with legs elevated, or as needed for leg cramps or low back pain.   Wear a good support or jogging bra for breast tenderness during  pregnancy. This may help if worn during sleep. Pads or tissues may be used in the bra if you are leaking colostrum.   Do not use hot tubs, steam rooms, or saunas.   Wear your seat belt when driving. This protects you and your baby if you are in an accident.   Avoid raw meat, cat litter boxes and soil used by cats. These carry germs that can cause birth defects in the baby.   It is easier to loose urine during pregnancy. Tightening up and strengthening the pelvic muscles will help with this problem. You can practice stopping your urination while you are going to the bathroom. These are the same muscles you need to strengthen. It is also the muscles you would use if you were trying to stop from passing gas. You can practice tightening these muscles up 10 times a set and repeating this about 3 times per day. Once you know what muscles to tighten up, do not perform these exercises during urination. It is more likely   to cause an infection by backing up the urine.   Ask for help if you have financial, counseling or nutritional needs during pregnancy. Your caregiver will be able to offer counseling for these needs as well as refer you for other special needs.   Make a list of emergency phone numbers and have them available.   Plan on getting help from family or friends when you go home from the hospital.   Make a trial run to the hospital.   Take prenatal classes with the father to understand, practice and ask questions about the labor and delivery.   Prepare the baby's room/nursery.   Do not travel out of the city unless it is absolutely necessary and with the advice of your caregiver.   Wear only low or no heal shoes to have better balance and prevent falling.  MEDICATIONS AND DRUG USE IN PREGNANCY  Take prenatal vitamins as directed. The vitamin should contain 1 milligram of folic acid. Keep all vitamins out of reach of children. Only a couple vitamins or tablets containing iron may be fatal  to a baby or young child when ingested.   Avoid use of all medications, including herbs, over-the-counter medications, not prescribed or suggested by your caregiver. Only take over-the-counter or prescription medicines for pain, discomfort, or fever as directed by your caregiver. Do not use aspirin, ibuprofen (Motrin, Advil, Nuprin) or naproxen (Aleve) unless OK'd by your caregiver.   Let your caregiver also know about herbs you may be using.   Alcohol is related to a number of birth defects. This includes fetal alcohol syndrome. All alcohol, in any form, should be avoided completely. Smoking will cause low birth rate and premature babies.   Street/illegal drugs are very harmful to the baby. They are absolutely forbidden. A baby born to an addicted mother will be addicted at birth. The baby will go through the same withdrawal an adult does.  SEEK MEDICAL CARE IF: You have any concerns or worries during your pregnancy. It is better to call with your questions if you feel they cannot wait, rather than worry about them. DECISIONS ABOUT CIRCUMCISION You may or may not know the sex of your baby. If you know your baby is a boy, it may be time to think about circumcision. Circumcision is the removal of the foreskin of the penis. This is the skin that covers the sensitive end of the penis. There is no proven medical need for this. Often this decision is made on what is popular at the time or based upon religious beliefs and social issues. You can discuss these issues with your caregiver or pediatrician. SEEK IMMEDIATE MEDICAL CARE IF:   An unexplained oral temperature above 102 F (38.9 C) develops, or as your caregiver suggests.   You have leaking of fluid from the vagina (birth canal). If leaking membranes are suspected, take your temperature and tell your caregiver of this when you call.   There is vaginal spotting, bleeding or passing clots. Tell your caregiver of the amount and how many pads are  used.   You develop a bad smelling vaginal discharge with a change in the color from clear to white.   You develop vomiting that lasts more than 24 hours.   You develop chills or fever.   You develop shortness of breath.   You develop burning on urination.   You loose more than 2 pounds of weight or gain more than 2 pounds of weight or as suggested by your   caregiver.   You notice sudden swelling of your face, hands, and feet or legs.   You develop belly (abdominal) pain. Round ligament discomfort is a common non-cancerous (benign) cause of abdominal pain in pregnancy. Your caregiver still must evaluate you.   You develop a severe headache that does not go away.   You develop visual problems, blurred or double vision.   If you have not felt your baby move for more than 1 hour. If you think the baby is not moving as much as usual, eat something with sugar in it and lie down on your left side for an hour. The baby should move at least 4 to 5 times per hour. Call right away if your baby moves less than that.   You fall, are in a car accident or any kind of trauma.   There is mental or physical violence at home.  Document Released: 04/18/2001 Document Revised: 04/13/2011 Document Reviewed: 10/21/2008 [employer] Patient Information 2012 [employer]. 

## 2011-08-09 ENCOUNTER — Ambulatory Visit (INDEPENDENT_AMBULATORY_CARE_PROVIDER_SITE_OTHER): Payer: Medicaid Other | Admitting: Advanced Practice Midwife

## 2011-08-09 ENCOUNTER — Encounter: Payer: Self-pay | Admitting: Advanced Practice Midwife

## 2011-08-09 VITALS — BP 119/72 | Temp 98.0°F | Wt 199.1 lb

## 2011-08-09 DIAGNOSIS — O2390 Unspecified genitourinary tract infection in pregnancy, unspecified trimester: Secondary | ICD-10-CM

## 2011-08-09 DIAGNOSIS — O98319 Other infections with a predominantly sexual mode of transmission complicating pregnancy, unspecified trimester: Secondary | ICD-10-CM

## 2011-08-09 DIAGNOSIS — Z3480 Encounter for supervision of other normal pregnancy, unspecified trimester: Secondary | ICD-10-CM

## 2011-08-09 NOTE — Progress Notes (Signed)
P=94,  

## 2011-08-09 NOTE — Patient Instructions (Signed)
Normal Labor and Delivery Your caregiver must first be sure you are in labor. Signs of labor include:  You may pass what is called "the mucus plug" before labor begins. This is a small amount of blood stained mucus.   Regular uterine contractions.   The time between contractions get closer together.   The discomfort and pain gradually gets more intense.   Pains are mostly located in the back.   Pains get worse when walking.   The cervix (the opening of the uterus becomes thinner (begins to efface) and opens up (dilates).  Once you are in labor and admitted into the hospital or care center, your caregiver will do the following:  A complete physical examination.   Check your vital signs (blood pressure, pulse, temperature and the fetal heart rate).   Do a vaginal examination (using a sterile glove and lubricant) to determine:   The position (presentation) of the baby (head [vertex] or buttock first).   The level (station) of the baby's head in the birth canal.   The effacement and dilatation of the cervix.   You may have your pubic hair shaved and be given an enema depending on your caregiver and the circumstance.   An electronic monitor is usually placed on your abdomen. The monitor follows the length and intensity of the contractions, as well as the baby's heart rate.   Usually, your caregiver will insert an IV in your arm with a bottle of sugar water. This is done as a precaution so that medications can be given to you quickly during labor or delivery.  NORMAL LABOR AND DELIVERY IS DIVIDED UP INTO 3 STAGES: First Stage This is when regular contractions begin and the cervix begins to efface and dilate. This stage can last from 3 to 15 hours. The end of the first stage is when the cervix is 100% effaced and 10 centimeters dilated. Pain medications may be given by   Injection (morphine, demerol, etc.)   Regional anesthesia (spinal, caudal or epidural, anesthetics given in  different locations of the spine). Paracervical pain medication may be given, which is an injection of and anesthetic on each side of the cervix.  A pregnant woman may request to have "Natural Childbirth" which is not to have any medications or anesthesia during her labor and delivery. Second Stage This is when the baby comes down through the birth canal (vagina) and is born. This can take 1 to 4 hours. As the baby's head comes down through the birth canal, you may feel like you are going to have a bowel movement. You will get the urge to bear down and push until the baby is delivered. As the baby's head is being delivered, the caregiver will decide if an episiotomy (a cut in the perineum and vagina area) is needed to prevent tearing of the tissue in this area. The episiotomy is sewn up after the delivery of the baby and placenta. Sometimes a mask with nitrous oxide is given for the mother to breath during the delivery of the baby to help if there is too much pain. The end of Stage 2 is when the baby is fully delivered. Then when the umbilical cord stops pulsating it is clamped and cut. Third Stage The third stage begins after the baby is completely delivered and ends after the placenta (afterbirth) is delivered. This usually takes 5 to 30 minutes. After the placenta is delivered, a medication is given either by intravenous or injection to help contract   the uterus and prevent bleeding. The third stage is not painful and pain medication is usually not necessary. If an episiotomy was done, it is repaired at this time. After the delivery, the mother is watched and monitored closely for 1 to 2 hours to make sure there is no postpartum bleeding (hemorrhage). If there is a lot of bleeding, medication is given to contract the uterus and stop the bleeding. Document Released: 02/01/2008 Document Revised: 04/13/2011 Document Reviewed: 02/01/2008 [employer] Patient Information 2012 [employer]. 

## 2011-08-09 NOTE — Progress Notes (Signed)
Feels well, ready. Reviewed labor processes

## 2011-08-12 NOTE — MAU Provider Note (Signed)
I examined pt and agree with documentation above and resident plan of care. Jodi Ryan,Jodi Ryan  

## 2011-08-16 ENCOUNTER — Encounter (HOSPITAL_COMMUNITY): Payer: Self-pay | Admitting: *Deleted

## 2011-08-16 ENCOUNTER — Telehealth (HOSPITAL_COMMUNITY): Payer: Self-pay | Admitting: *Deleted

## 2011-08-16 ENCOUNTER — Ambulatory Visit (INDEPENDENT_AMBULATORY_CARE_PROVIDER_SITE_OTHER): Payer: Medicaid Other | Admitting: Physician Assistant

## 2011-08-16 VITALS — BP 120/71 | Temp 97.3°F | Wt 201.4 lb

## 2011-08-16 DIAGNOSIS — O480 Post-term pregnancy: Secondary | ICD-10-CM

## 2011-08-16 DIAGNOSIS — Z3480 Encounter for supervision of other normal pregnancy, unspecified trimester: Secondary | ICD-10-CM

## 2011-08-16 LAB — POCT URINALYSIS DIP (DEVICE): Glucose, UA: NEGATIVE mg/dL

## 2011-08-16 NOTE — Progress Notes (Signed)
Pt desires IOL @ 41 wks - scheduled 08/22/11  @0700 

## 2011-08-16 NOTE — Telephone Encounter (Signed)
Preadmission screen  

## 2011-08-16 NOTE — Progress Notes (Signed)
Pulse: 93

## 2011-08-16 NOTE — Patient Instructions (Signed)
Labor Induction  Most women go into labor on their own between 37 and 42 weeks of the pregnancy. When this does not happen or when there is a medical need, medicine or other methods may be used to induce labor. Labor induction causes a pregnant woman's uterus to contract. It also causes the cervix to soften (ripen), open (dilate), and thin out (efface). Usually, labor is not induced before 39 weeks of the pregnancy unless there is a problem with the baby or mother. Whether your labor will be induced depends on a number of factors, including the following:  The medical condition of you and the baby.   How many weeks along you are.   The status of baby's lung maturity.   The condition of the cervix.   The position of the baby.  REASONS FOR LABOR INDUCTION  The health of the baby or mother is at risk.   The pregnancy is overdue by 1 week or more.   The water breaks but labor does not start on its own.   The mother has a health condition or serious illness such as high blood pressure, infection, placental abruption, or diabetes.   The amniotic fluid amounts are low around the baby.   The baby is distressed.  REASONS TO NOT INDUCE LABOR Labor induction may not be a good idea if:  It is shown that your baby does not tolerate labor.   An induction is just more convenient.   You want the baby to be born on a certain date, like a holiday.   You have had previous surgeries on your uterus, such as a myomectomy or the removal of fibroids.   Your placenta lies very low in the uterus and blocks the opening of the cervix (placenta previa).   Your baby is not in a head down position.   The umbilical cord drops down into the birth canal in front of the baby. This could cut off the baby's blood and oxygen supply.   You have had a previous cesarean delivery.   There areunusual circumstances, such as the baby being extremely premature.  RISKS AND COMPLICATIONS Problems may occur in the  process of induction and plans may need to be modified as a situation unfolds. Some of the risks of induction include:  Change in fetal heart rate, such as too high, too low, or erratic.   Risk of fetal distress.   Risk of infection to mother and baby.   Increased chance of having a cesarean delivery.   The rare, but increased chance that the placenta will separate from the uterus (abruption).   Uterine rupture (very rare).  When induction is needed for medical reasons, the benefits of induction may outweigh the risks. BEFORE THE PROCEDURE Your caregiver will check your cervix and the baby's position. This will help your caregiver decide if you are far enough along for an induction to work. PROCEDURE Several methods of labor induction may be used, such as:   Taking prostaglandin medicine to dilate and ripen the cervix. The medicine will also start contractions. It can be taken by mouth or by inserting a suppository into the vagina.   A thin tube (catheter) with a balloon on the end may be inserted into your vagina to dilate the cervix. Once inserted, the balloon expands with water, which causes the cervix to open.   Striping the membranes. Your caregiver inserts a finger between the cervix and membranes, which causes the cervix to be stretched and   may cause the uterus to contract. This is often done during an office visit. You will be sent home to wait for the contractions to begin. You will then come in for an induction.   Breaking the water. Your caregiver will make a hole in the amniotic sac using a small instrument. Once the amniotic sac breaks, contractions should begin. This may still take hours to see an effect.   Taking medicine to trigger or strengthen contractions. This medicine is given intravenously through a tube in your arm.  All of the methods of induction, besides stripping the membranes, will be done in the hospital. Induction is done in the hospital so that you and the  baby can be carefully monitored. AFTER THE PROCEDURE Some inductions can take up to 2 or 3 days. Depending on the cervix, it usually takes less time. It takes longer when you are induced early in the pregnancy or if this is your first pregnancy. If a mother is still pregnant and the induction has been going on for 2 to 3 days, either the mother will be sent home or a cesarean delivery will be needed. Document Released: 09/13/2006 Document Revised: 04/13/2011 Document Reviewed: 02/27/2011 [employer] Patient Information 2012 [employer]. 

## 2011-08-16 NOTE — Progress Notes (Signed)
NST reactive. Membranes swept. Labor precautions

## 2011-08-18 ENCOUNTER — Ambulatory Visit (INDEPENDENT_AMBULATORY_CARE_PROVIDER_SITE_OTHER): Payer: Medicaid Other | Admitting: *Deleted

## 2011-08-18 VITALS — BP 118/61 | Wt 204.1 lb

## 2011-08-18 DIAGNOSIS — O480 Post-term pregnancy: Secondary | ICD-10-CM

## 2011-08-18 NOTE — Progress Notes (Signed)
P = 90   IOL scheduled on 08/22/11 @ 0700.  Sx of labor reviewed.

## 2011-08-19 ENCOUNTER — Encounter (HOSPITAL_COMMUNITY): Payer: Self-pay | Admitting: *Deleted

## 2011-08-19 ENCOUNTER — Inpatient Hospital Stay (HOSPITAL_COMMUNITY)
Admission: AD | Admit: 2011-08-19 | Discharge: 2011-08-20 | Disposition: A | Discharge status: Home / Self Care | Payer: Medicaid Other | Source: Ambulatory Visit | Attending: Obstetrics & Gynecology | Admitting: Obstetrics & Gynecology

## 2011-08-19 DIAGNOSIS — O479 False labor, unspecified: Secondary | ICD-10-CM | POA: Insufficient documentation

## 2011-08-19 DIAGNOSIS — O99891 Other specified diseases and conditions complicating pregnancy: Secondary | ICD-10-CM | POA: Insufficient documentation

## 2011-08-19 NOTE — MAU Note (Signed)
Dr Konrad Dolores on unit. Aware of pt's admission and status.

## 2011-08-19 NOTE — MAU Provider Note (Signed)
History     CSN: [id number]  Arrival date and time: 08/19/11 2133   First Provider Initiated Contact with Patient 08/19/11 2335      Chief Complaint  Patient presents with  . Rupture of Membranes   HPI 21yo [redacted]w[redacted]d (LMP) complaining of a loss of fluid today at 1500 that leaked her panty liner and panties. Denies contractions. Reports several fetal movements every hour. Denies any recent fever, emesis, HA, SOB, CP, vaginal bleeding, other blood loss. Pregnancy uncomplicated. Previous pregnancy delivered at 41wks.    OB History    Grav Para Term Preterm Abortions TAB SAB Ect Mult Living   2 1 1              Past Medical History  Diagnosis Date  . Anemia   . Abnormal Pap smear 2010    had LEEP  . Chlamydia     Past Surgical History  Procedure Date  . Leep     Family History  Problem Relation Age of Onset  . Hypertension Maternal Grandmother   . Diabetes Paternal Grandmother   . Hypotension Neg Hx   . Anesthesia problems Neg Hx   . Malignant hyperthermia Neg Hx   . Pseudochol deficiency Neg Hx     History  Substance Use Topics  . Smoking status: Never Smoker   . Smokeless tobacco: Never Used  . Alcohol Use: No    Allergies: No Known Allergies  Prescriptions prior to admission  Medication Sig Dispense Refill  . acetaminophen (TYLENOL) 325 MG tablet Take 650 mg by mouth every 6 (six) hours as needed.      . calcium carbonate (TUMS - DOSED IN MG ELEMENTAL CALCIUM) 500 MG chewable tablet Chew 1 tablet by mouth as needed. For heartburn      . Prenatal Vit-Fe Fumarate-FA (PRENATAL VITAMINS PLUS) 27-1 MG TABS Take 1 tablet by mouth daily.  30 tablet  5    Review of Systems  Constitutional: Negative for fever and chills.  Eyes: Negative for blurred vision.  Respiratory: Negative for shortness of breath.   Cardiovascular: Negative for chest pain.  Gastrointestinal: Positive for nausea. Negative for heartburn, vomiting, abdominal pain, diarrhea, constipation and  blood in stool.  Genitourinary: Negative for dysuria, urgency, frequency and hematuria.  Skin: Negative for rash.  Neurological: Negative for dizziness, sensory change, speech change and headaches.   Physical Exam   Blood pressure 121/67, pulse 98, temperature 98.2 F (36.8 C), temperature source Oral, resp. rate 18, height 5\' 6"  (1.676 m), weight 92.59 kg (204 lb 2 oz), last menstrual period 11/08/2010.  Physical Exam  Constitutional: She is oriented to person, place, and time. She appears well-developed and well-nourished. No distress.  HENT:  Head: Atraumatic.  Eyes: EOM are normal.  Cardiovascular: Normal rate and regular rhythm.   Respiratory: Effort normal.  GI: Soft. There is no tenderness. There is no rebound and no guarding.  Genitourinary: Vagina normal and uterus normal. No vaginal discharge found.  Musculoskeletal: Normal range of motion.  Neurological: She is alert and oriented to person, place, and time. She has normal reflexes.  Skin: Skin is warm and dry. No rash noted. No erythema.  Psychiatric: She has a normal mood and affect. Her behavior is normal. Judgment and thought content normal.   No vaginal pooling. No ferning on exam   MAU Course  Procedures  Assessment and Plan  21yo [redacted]w[redacted]d not in labor. Mother and baby apear well on monitor. Infrequent contractions not felt by mother. No  ROM. Stable for discharge. Reasons for return provided to pt.   Alanya Vukelich 08/19/2011, 11:42 PM

## 2011-08-19 NOTE — MAU Note (Signed)
Pt states, "At 3 pm I had a gush of water that soaked my panty liner. Since then I've had some mucus after my shower."

## 2011-08-20 NOTE — MAU Provider Note (Signed)
I examined pt and agree with documentation above and resident plan of care. MUHAMMAD,Daveda Larock  

## 2011-08-20 NOTE — Progress Notes (Signed)
Written and verba d/c instructions given and understanding voiced

## 2011-08-22 ENCOUNTER — Encounter (HOSPITAL_COMMUNITY): Payer: Self-pay | Admitting: Anesthesiology

## 2011-08-22 ENCOUNTER — Inpatient Hospital Stay (HOSPITAL_COMMUNITY): Payer: Medicaid Other | Admitting: Anesthesiology

## 2011-08-22 ENCOUNTER — Encounter (HOSPITAL_COMMUNITY): Payer: Self-pay

## 2011-08-22 ENCOUNTER — Inpatient Hospital Stay (HOSPITAL_COMMUNITY)
Admission: RE | Admit: 2011-08-22 | Discharge: 2011-08-24 | DRG: 775 | Disposition: A | Discharge status: Home / Self Care | Payer: Medicaid Other | Source: Ambulatory Visit | Attending: Obstetrics & Gynecology | Admitting: Obstetrics & Gynecology

## 2011-08-22 VITALS — BP 107/63 | HR 84 | Temp 97.7°F | Resp 18 | Ht 66.0 in | Wt 204.0 lb

## 2011-08-22 DIAGNOSIS — O3440 Maternal care for other abnormalities of cervix, unspecified trimester: Secondary | ICD-10-CM

## 2011-08-22 DIAGNOSIS — O480 Post-term pregnancy: Principal | ICD-10-CM | POA: Diagnosis present

## 2011-08-22 DIAGNOSIS — Z3480 Encounter for supervision of other normal pregnancy, unspecified trimester: Secondary | ICD-10-CM

## 2011-08-22 LAB — CBC
HCT: 34.6 % — ABNORMAL LOW (ref 36.0–46.0)
Hemoglobin: 11.4 g/dL — ABNORMAL LOW (ref 12.0–15.0)
MCH: 27.6 pg (ref 26.0–34.0)
MCV: 83.8 fL (ref 78.0–100.0)
Platelets: 224 10*3/uL (ref 150–400)
RBC: 4.13 MIL/uL (ref 3.87–5.11)
WBC: 7.3 10*3/uL (ref 4.0–10.5)

## 2011-08-22 MED ORDER — DIPHENHYDRAMINE HCL 25 MG PO CAPS: 25.0000 mg | ORAL_CAPSULE | Freq: Four times a day (QID) | ORAL | Status: DC | PRN

## 2011-08-22 MED ORDER — EPHEDRINE 5 MG/ML INJ: 10.0000 mg | INTRAVENOUS | Status: DC | PRN

## 2011-08-22 MED ORDER — ZOLPIDEM TARTRATE 5 MG PO TABS: 5.0000 mg | ORAL_TABLET | Freq: Every evening | ORAL | Status: DC | PRN

## 2011-08-22 MED ORDER — IBUPROFEN 600 MG PO TABS: 600.0000 mg | ORAL_TABLET | Freq: Four times a day (QID) | ORAL | Status: DC | PRN

## 2011-08-22 MED ORDER — OXYCODONE-ACETAMINOPHEN 5-325 MG PO TABS
1.0000 | ORAL_TABLET | ORAL | Status: DC | PRN
Start: 2011-08-22 — End: 2011-08-24
  Administered 2011-08-23: 1 via ORAL
  Filled 2011-08-22: qty 1

## 2011-08-22 MED ORDER — TETANUS-DIPHTH-ACELL PERTUSSIS 5-2.5-18.5 LF-MCG/0.5 IM SUSP
0.5000 mL | Freq: Once | INTRAMUSCULAR | Status: AC
  Administered 2011-08-23: 0.5 mL via INTRAMUSCULAR
  Filled 2011-08-22: qty 0.5

## 2011-08-22 MED ORDER — DIBUCAINE 1 % RE OINT: 1.0000 "application " | TOPICAL_OINTMENT | RECTAL | Status: DC | PRN

## 2011-08-22 MED ORDER — LIDOCAINE HCL (PF) 1 % IJ SOLN
30.0000 mL | INTRAMUSCULAR | Status: DC | PRN
  Filled 2011-08-22: qty 30

## 2011-08-22 MED ORDER — ACETAMINOPHEN 325 MG PO TABS: 650.0000 mg | ORAL_TABLET | ORAL | Status: DC | PRN

## 2011-08-22 MED ORDER — SIMETHICONE 80 MG PO CHEW: 80.0000 mg | CHEWABLE_TABLET | ORAL | Status: DC | PRN

## 2011-08-22 MED ORDER — TERBUTALINE SULFATE 1 MG/ML IJ SOLN: 0.2500 mg | Freq: Once | INTRAMUSCULAR | Status: DC | PRN

## 2011-08-22 MED ORDER — EPHEDRINE 5 MG/ML INJ
10.0000 mg | INTRAVENOUS | Status: DC | PRN
  Filled 2011-08-22: qty 4

## 2011-08-22 MED ORDER — OXYTOCIN 20 UNITS IN LACTATED RINGERS INFUSION - SIMPLE
125.0000 mL/h | Freq: Once | INTRAVENOUS | Status: AC
  Administered 2011-08-22: 999 mL/h via INTRAVENOUS
  Filled 2011-08-22: qty 1000

## 2011-08-22 MED ORDER — ONDANSETRON HCL 4 MG PO TABS: 4.0000 mg | ORAL_TABLET | ORAL | Status: DC | PRN

## 2011-08-22 MED ORDER — OXYTOCIN BOLUS FROM INFUSION
500.0000 mL | Freq: Once | INTRAVENOUS | Status: DC
  Filled 2011-08-22: qty 500

## 2011-08-22 MED ORDER — LANOLIN HYDROUS EX OINT: TOPICAL_OINTMENT | CUTANEOUS | Status: DC | PRN

## 2011-08-22 MED ORDER — LACTATED RINGERS IV SOLN: 500.0000 mL | INTRAVENOUS | Status: DC | PRN

## 2011-08-22 MED ORDER — OXYTOCIN 20 UNITS IN LACTATED RINGERS INFUSION - SIMPLE
1.0000 m[IU]/min | INTRAVENOUS | Status: DC
Start: 2011-08-22 — End: 2011-08-22
  Administered 2011-08-22: 2 m[IU]/min via INTRAVENOUS

## 2011-08-22 MED ORDER — OXYCODONE-ACETAMINOPHEN 5-325 MG PO TABS: 1.0000 | ORAL_TABLET | ORAL | Status: DC | PRN

## 2011-08-22 MED ORDER — PRENATAL MULTIVITAMIN CH
1.0000 | ORAL_TABLET | Freq: Every day | ORAL | Status: DC
  Administered 2011-08-23 – 2011-08-24 (×2): 1 via ORAL
  Filled 2011-08-22 (×2): qty 1

## 2011-08-22 MED ORDER — DIPHENHYDRAMINE HCL 50 MG/ML IJ SOLN: 12.5000 mg | INTRAMUSCULAR | Status: DC | PRN

## 2011-08-22 MED ORDER — ONDANSETRON HCL 4 MG/2ML IJ SOLN: 4.0000 mg | Freq: Four times a day (QID) | INTRAMUSCULAR | Status: DC | PRN

## 2011-08-22 MED ORDER — PHENYLEPHRINE 40 MCG/ML (10ML) SYRINGE FOR IV PUSH (FOR BLOOD PRESSURE SUPPORT)
80.0000 ug | PREFILLED_SYRINGE | INTRAVENOUS | Status: DC | PRN
  Filled 2011-08-22: qty 5

## 2011-08-22 MED ORDER — FENTANYL 2.5 MCG/ML BUPIVACAINE 1/10 % EPIDURAL INFUSION (WH - ANES)
INTRAMUSCULAR | Status: DC | PRN
Start: 2011-08-22 — End: 2011-08-22
  Administered 2011-08-22: 14 mL/h via EPIDURAL

## 2011-08-22 MED ORDER — CITRIC ACID-SODIUM CITRATE 334-500 MG/5ML PO SOLN: 30.0000 mL | ORAL | Status: DC | PRN

## 2011-08-22 MED ORDER — LACTATED RINGERS IV SOLN
500.0000 mL | Freq: Once | INTRAVENOUS | Status: AC
  Administered 2011-08-22: 1000 mL via INTRAVENOUS

## 2011-08-22 MED ORDER — PHENYLEPHRINE 40 MCG/ML (10ML) SYRINGE FOR IV PUSH (FOR BLOOD PRESSURE SUPPORT): 80.0000 ug | PREFILLED_SYRINGE | INTRAVENOUS | Status: DC | PRN

## 2011-08-22 MED ORDER — FLEET ENEMA 7-19 GM/118ML RE ENEM: 1.0000 | ENEMA | RECTAL | Status: DC | PRN

## 2011-08-22 MED ORDER — LACTATED RINGERS IV SOLN
INTRAVENOUS | Status: DC
  Administered 2011-08-22: 07:00:00 via INTRAVENOUS

## 2011-08-22 MED ORDER — ONDANSETRON HCL 4 MG/2ML IJ SOLN: 4.0000 mg | INTRAMUSCULAR | Status: DC | PRN

## 2011-08-22 MED ORDER — SENNOSIDES-DOCUSATE SODIUM 8.6-50 MG PO TABS
2.0000 | ORAL_TABLET | Freq: Every day | ORAL | Status: DC
  Administered 2011-08-23: 2 via ORAL

## 2011-08-22 MED ORDER — WITCH HAZEL-GLYCERIN EX PADS: 1.0000 "application " | MEDICATED_PAD | CUTANEOUS | Status: DC | PRN

## 2011-08-22 MED ORDER — IBUPROFEN 600 MG PO TABS
600.0000 mg | ORAL_TABLET | Freq: Four times a day (QID) | ORAL | Status: DC
  Administered 2011-08-22 – 2011-08-24 (×6): 600 mg via ORAL
  Filled 2011-08-22 (×6): qty 1

## 2011-08-22 MED ORDER — FENTANYL 2.5 MCG/ML BUPIVACAINE 1/10 % EPIDURAL INFUSION (WH - ANES)
14.0000 mL/h | INTRAMUSCULAR | Status: DC
  Filled 2011-08-22: qty 60

## 2011-08-22 MED ORDER — BUTORPHANOL TARTRATE 2 MG/ML IJ SOLN
1.0000 mg | INTRAMUSCULAR | Status: DC | PRN
  Administered 2011-08-22: 1 mg via INTRAVENOUS
  Filled 2011-08-22: qty 1

## 2011-08-22 MED ORDER — BENZOCAINE-MENTHOL 20-0.5 % EX AERO: 1.0000 "application " | INHALATION_SPRAY | CUTANEOUS | Status: DC | PRN

## 2011-08-22 MED ORDER — LIDOCAINE HCL (PF) 1 % IJ SOLN
INTRAMUSCULAR | Status: DC | PRN
  Administered 2011-08-22 (×2): 8 mL

## 2011-08-22 MED ORDER — BENZOCAINE-MENTHOL 20-0.5 % EX AERO
INHALATION_SPRAY | CUTANEOUS | Status: AC
  Filled 2011-08-22: qty 56

## 2011-08-22 NOTE — Progress Notes (Signed)
NSVD of viable female. 

## 2011-08-22 NOTE — Progress Notes (Signed)
Ginamarie Banfield is a 21 y.o. G2P1001 at [redacted]w[redacted]d  Subjective: Getting up to birthing ball next to bed; feeling ctx stronger  Objective: BP 127/67  Pulse 108  Temp(Src) 98.7 F (37.1 C) (Oral)  Resp 18  Ht 5\' 6"  (1.676 m)  Wt 92.534 kg (204 lb)  BMI 32.93 kg/m2  LMP 11/08/2010      FHT:  FHR: 150 bpm, variability: moderate,  accelerations:  Present,  decelerations:  Absent 10x10 accels with occ mi variables UC:   irregular, every 2-4 minutes with Pitocin at 59mu/min SVE:   Dilation: 4 Effacement (%): 50 Station: -2 Exam by:: Edd Arbour MDexam not repeated  Labs: Lab Results  Component Value Date   WBC 7.3 08/22/2011   HGB 11.4* 08/22/2011   HCT 34.6* 08/22/2011   MCV 83.8 08/22/2011   PLT 224 08/22/2011    Assessment / Plan: IOL process  Continue to increase Pitocin to achieve reg/freq ctx  Cam Hai 08/22/2011, 6:08 PM

## 2011-08-22 NOTE — Anesthesia Preprocedure Evaluation (Addendum)

## 2011-08-22 NOTE — Anesthesia Procedure Notes (Signed)
Epidural Patient location during procedure: OB Start time: 08/22/2011 7:05 PM End time: 08/22/2011 7:09 PM Reason for block: procedure for pain  Staffing Anesthesiologist: Sandrea Hughs  Preanesthetic Checklist Completed: patient identified, site marked, surgical consent, pre-op evaluation, timeout performed, IV checked, risks and benefits discussed and monitors and equipment checked  Epidural Patient position: sitting Prep: site prepped and draped and DuraPrep Patient monitoring: continuous pulse ox and blood pressure Approach: midline Injection technique: LOR air  Needle:  Needle type: Tuohy  Needle gauge: 17 G Needle length: 9 cm Needle insertion depth: 7 cm Catheter type: closed end flexible Catheter size: 19 Gauge Catheter at skin depth: 12 cm Test dose: negative and Other  Assessment Sensory level: T8 Events: blood not aspirated, injection not painful, no injection resistance, negative IV test and no paresthesia

## 2011-08-22 NOTE — Progress Notes (Signed)
Patient ID: Jodi Ryan, female   DOB: 1990/12/12, 21 y.o.   MRN: [id number] Subjective:  Jodi Ryan is a 21 y.o. G2 P1 female with [facility] 08/14/11 at 41 and 0/[redacted] weeks gestation who is being admitted for induction of labor.  Her current obstetrical history is significant for Leep procedure, and UTI/Chlymidia, both treated.  Patient reports occasional contractions.   Fetal Movement: normal.  No bleeding, no discharge.    Objective:   Vital signs in last 24 hours: Temp:  [98.1 F (36.7 C)-98.6 F (37 C)] 98.1 F (36.7 C) (04/16 1019) Pulse Rate:  [88-114] 88  (04/16 1019) Resp:  [20] 20  (04/16 1019) BP: (111-122)/(72-73) 111/72 mmHg (04/16 1019) Weight:  [204 lb (92.534 kg)] 204 lb (92.534 kg) (04/16 0807)   General:   alert, cooperative and no distress  Skin:   normal  HEENT:  PERRLA  Lungs:   clear to auscultation bilaterally  Heart:   regular rate and rhythm, S1, S2 normal, no murmur, click, rub or gallop  Breasts:   deferred  Abdomen:  soft, non-tender; bowel sounds normal; no masses,  no organomegaly and Gravid. Leopold's: vertex position.   Pelvis:  Vulva and vagina appear normal. Bimanual exam reveals normal uterus and adnexa. Vaginal: normal mucosa without prolapse or lesions Cervix: dilated 4 cm.   FHT:  157 BPM  Uterine Size: size equals dates  Presentations: cephalic  Cervix:    Dilation: 4 cm   Effacement: 50%   Station:  -2   Consistency: medium   Position: posterior   Lab Review  Rubella-immune, Hepatitis B surface antigen non-reactive, GBS negative  AFP:not done.   One hour GTT: Normal    Assessment/Plan:  41 and 0/[redacted] weeks gestation. Not in labor.   Starting Pitocin per Order set. Will recheck patient in 3 hours.    Risks, benefits, alternatives and possible complications have been discussed in detail with the patient.  Pre-admission, admission, and post admission procedures and expectations were discussed in detail.  All questions answered, all  appropriate consents will be signed at the Hospital. Admission is planned for today.  Anticipate vaginal delivery. and Intervention: IV Pitocin induction

## 2011-08-22 NOTE — Progress Notes (Signed)
Jodi Ryan is a 21 y.o. G2P1001 at [redacted]w[redacted]d by ultrasound admitted for induction of labor due to Post dates.   Subjective: No complaints No epidural  Objective: BP 124/73  Pulse 85  Temp(Src) 98.5 F (36.9 C) (Oral)  Resp 18  Ht 5\' 6"  (1.676 m)  Wt 204 lb (92.534 kg)  BMI 32.93 kg/m2  LMP 11/08/2010      FHT:  FHR: 140 bpm, variability: moderate,  accelerations:  Present,  decelerations:  Absent UC:   regular, every 5 minutes SVE:   Dilation: 4 Effacement (%): 50 Station: -2 Exam by:: Edd Arbour MD  Labs: Lab Results  Component Value Date   WBC 7.3 08/22/2011   HGB 11.4* 08/22/2011   HCT 34.6* 08/22/2011   MCV 83.8 08/22/2011   PLT 224 08/22/2011    Assessment / Plan: Induction of labor due to Post dates,  progressing well on pitocin AROM performed at 3:47 PM  Labor: pitocin, AROM performed Preeclampsia:  no signs or symptoms of toxicity Fetal Wellbeing:  Category I Pain Control:  Labor support without medications I/D:  n/a Anticipated MOD:  NSVD  Deshauna Cayson MD 08/22/2011, 3:46 PM

## 2011-08-22 NOTE — Progress Notes (Signed)
Jodi Ryan is a 21 y.o. G2P1001 at [redacted]w[redacted]d by LMP admitted for induction of labor due to Post dates. Due date 08/15/11 .  Subjective:  Patient resting comfortably. Reports mild pain with contractions. Patient has no concerns.   Objective: BP 119/74  Pulse 87  Temp(Src) 98.5 F (36.9 C) (Oral)  Resp 20  Ht 5\' 6"  (1.676 m)  Wt 92.534 kg (204 lb)  BMI 32.93 kg/m2  LMP 11/08/2010      FHT:  FHR: 150 bpm, variability: moderate,  accelerations:  Present,  decelerations:  Absent UC:   regular, every 3.5 minutes SVE:   Dilation: 4 Effacement (%): 50 Station: -2 Exam by:: Dr. Rivka Safer  Labs: Lab Results  Component Value Date   WBC 7.3 08/22/2011   HGB 11.4* 08/22/2011   HCT 34.6* 08/22/2011   MCV 83.8 08/22/2011   PLT 224 08/22/2011    Assessment / Plan: Induction of labor due to postterm,  progressing well on pitocin  Labor: Progressing on Pitocin, will continue to increase as indicated by contractions Fetal Wellbeing:  Category I Pain Control:  Labor support without medications Anticipated MOD:  NSVD  Jodi Ryan 08/22/2011, 3:21 PM

## 2011-08-22 NOTE — Progress Notes (Signed)
NST performed on 08/18/2011 was reviewed and was found to be reactive.  Continue recommended antenatal testing and prenatal care.    

## 2011-08-23 NOTE — Progress Notes (Signed)
I have seen and examined this patient and I agree with the above. Skene, Gay Moncivais 9:09 AM 08/23/2011

## 2011-08-23 NOTE — Progress Notes (Signed)
UR chart review completed.  

## 2011-08-23 NOTE — Anesthesia Postprocedure Evaluation (Signed)
  Anesthesia Post-op Note  Patient: Jodi Ryan  Procedure(s) Performed: * No procedures listed *  Patient Location: Mother/Baby  Anesthesia Type: Epidural  Level of Consciousness: awake, alert  and oriented  Airway and Oxygen Therapy: Patient Spontanous Breathing  Post-op Pain: none  Post-op Assessment: Post-op Vital signs reviewed, Patient's Cardiovascular Status Stable, No headache, No backache, No residual numbness and No residual motor weakness  Post-op Vital Signs: Reviewed and stable  Complications: No apparent anesthesia complications

## 2011-08-23 NOTE — Addendum Note (Signed)
Addendum  created 08/23/11 1849 by Dana Allan, MD   Modules edited:Charting, Inpatient Notes

## 2011-08-23 NOTE — Progress Notes (Signed)
Post Partum Day 1 Subjective: no complaints, up ad lib, voiding, tolerating PO and + flatus  Objective: Blood pressure 103/62, pulse 87, temperature 97.7 F (36.5 C), temperature source Oral, resp. rate 18, height 5\' 6"  (1.676 m), weight 92.534 kg (204 lb), last menstrual period 11/08/2010, SpO2 100.00%, unknown if currently breastfeeding.  Physical Exam:  General: alert, cooperative and no distress Lochia: appropriate Uterine Fundus: firm DVT Evaluation: No evidence of DVT seen on physical exam.   Basename 08/22/11 0813  HGB 11.4*  HCT 34.6*    Assessment/Plan: Plan for discharge tomorrow, Breastfeeding, Lactation consult, Social Work consult and Contraception OCP   LOS: 1 day   Betania Dizon 08/23/2011, 7:40 AM

## 2011-08-24 MED ORDER — OXYCODONE-ACETAMINOPHEN 5-325 MG PO TABS: 1.0000 | ORAL_TABLET | ORAL | Status: DC | PRN

## 2011-08-24 MED ORDER — IBUPROFEN 600 MG PO TABS: 600.0000 mg | ORAL_TABLET | Freq: Four times a day (QID) | ORAL | Status: AC

## 2011-08-24 NOTE — Discharge Summary (Signed)
Obstetric Discharge Summary Reason for Admission: induction of labor Prenatal Procedures: none Intrapartum Procedures: spontaneous vaginal delivery Postpartum Procedures: none Complications-Operative and Postpartum: none Hemoglobin  Date Value Range Status  08/22/2011 11.4* 12.0-15.0 (g/dL) Final     HCT  Date Value Range Status  08/22/2011 34.6* 36.0-46.0 (%) Final    Physical Exam:  General: alert, cooperative, appears stated age and no distress Lochia: appropriate Uterine Fundus: firm DVT Evaluation: No evidence of DVT seen on physical exam.  Discharge Diagnoses: Post-date pregnancy SVD  Discharge Information: Date: 08/24/2011 Activity: unrestricted and pelvic rest Diet: routine Medications: Ibuprofen Condition: stable Instructions: refer to practice specific booklet Discharge to: home   Newborn Data: Live born female  Birth Weight: 8 lb 9.4 oz (3895 g) APGAR: 8, 9  Home with mother.  Jodi Ryan 08/24/2011, 7:50 AM

## 2011-09-29 ENCOUNTER — Ambulatory Visit: Payer: Medicaid Other | Admitting: Physician Assistant

## 2012-08-07 ENCOUNTER — Encounter (HOSPITAL_COMMUNITY): Payer: Self-pay

## 2012-08-07 ENCOUNTER — Emergency Department (HOSPITAL_COMMUNITY)
Admission: EM | Admit: 2012-08-07 | Discharge: 2012-08-07 | Disposition: A | Discharge status: Home / Self Care | Payer: Medicaid Other | Attending: Emergency Medicine | Admitting: Emergency Medicine

## 2012-08-07 DIAGNOSIS — R300 Dysuria: Secondary | ICD-10-CM | POA: Insufficient documentation

## 2012-08-07 DIAGNOSIS — Z8619 Personal history of other infectious and parasitic diseases: Secondary | ICD-10-CM | POA: Insufficient documentation

## 2012-08-07 DIAGNOSIS — R350 Frequency of micturition: Secondary | ICD-10-CM | POA: Insufficient documentation

## 2012-08-07 DIAGNOSIS — Z3202 Encounter for pregnancy test, result negative: Secondary | ICD-10-CM | POA: Insufficient documentation

## 2012-08-07 DIAGNOSIS — Z862 Personal history of diseases of the blood and blood-forming organs and certain disorders involving the immune mechanism: Secondary | ICD-10-CM | POA: Insufficient documentation

## 2012-08-07 DIAGNOSIS — N390 Urinary tract infection, site not specified: Secondary | ICD-10-CM

## 2012-08-07 LAB — URINE MICROSCOPIC-ADD ON

## 2012-08-07 LAB — URINALYSIS, ROUTINE W REFLEX MICROSCOPIC
Hgb urine dipstick: NEGATIVE
Ketones, ur: NEGATIVE mg/dL
Protein, ur: NEGATIVE mg/dL
Urobilinogen, UA: 0.2 mg/dL (ref 0.0–1.0)

## 2012-08-07 LAB — POCT PREGNANCY, URINE: Preg Test, Ur: NEGATIVE

## 2012-08-07 MED ORDER — CEPHALEXIN 500 MG PO CAPS: 500.0000 mg | ORAL_CAPSULE | Freq: Four times a day (QID) | ORAL | Status: DC

## 2012-08-07 NOTE — ED Notes (Signed)
Pt complains of urinary urgency, burning and an odor for about one week

## 2012-08-07 NOTE — ED Provider Notes (Signed)
History     CSN: [id number]  Arrival date & time 08/07/12  4098   First MD Initiated Contact with Patient 08/07/12 2107      Chief Complaint  Patient presents with  . Urinary Urgency    (Consider location/radiation/quality/duration/timing/severity/associated sxs/prior treatment) HPI Comments: This is a 22 year old female, no pertinent past medical history, who presents emergency department with chief complaint of urinary urgency, frequency, and dysuria. Patient states that she's been having the symptoms for the past week. She states that today, she started noticing a foul odor. She is tried treating her symptoms with cranberry AZO, and by drinking a lot water. She states that her symptoms are moderate in severity. She denies any fever, chills, nausea, or vomiting. She denies any unusual vaginal discharge.  The history is provided by the patient. No language interpreter was used.    Past Medical History  Diagnosis Date  . Anemia   . Abnormal Pap smear 2010    had LEEP  . Chlamydia     Past Surgical History  Procedure Laterality Date  . Leep      Family History  Problem Relation Age of Onset  . Hypertension Maternal Grandmother   . Diabetes Paternal Grandmother   . Hypotension Neg Hx   . Anesthesia problems Neg Hx   . Malignant hyperthermia Neg Hx   . Pseudochol deficiency Neg Hx     History  Substance Use Topics  . Smoking status: Never Smoker   . Smokeless tobacco: Never Used  . Alcohol Use: No    OB History   Grav Para Term Preterm Abortions TAB SAB Ect Mult Living   2 2 2  0 0 0 0 0 0 2      Review of Systems  All other systems reviewed and are negative.    Allergies  Review of patient's allergies indicates no known allergies.  Home Medications   Current Outpatient Rx  Name  Route  Sig  Dispense  Refill  . acetaminophen (TYLENOL) 325 MG tablet   Oral   Take 650 mg by mouth every 6 (six) hours as needed for pain.          . Cranberry-Vitamin  C-Probiotic (AZO CRANBERRY PO)   Oral   Take 1 capsule by mouth daily.           BP 127/75  Pulse 84  Temp(Src) 98.1 F (36.7 C) (Oral)  Resp 18  Ht 5\' 6"  (1.676 m)  Wt 175 lb (79.379 kg)  BMI 28.26 kg/m2  SpO2 100%  LMP 08/05/2012  Physical Exam  Nursing note and vitals reviewed. Constitutional: She is oriented to person, place, and time. She appears well-developed and well-nourished.  HENT:  Head: Normocephalic and atraumatic.  Eyes: Conjunctivae and EOM are normal. Pupils are equal, round, and reactive to light.  Neck: Normal range of motion. Neck supple.  Cardiovascular: Normal rate and regular rhythm.  Exam reveals no gallop and no friction rub.   No murmur heard. Pulmonary/Chest: Effort normal and breath sounds normal. No respiratory distress. She has no wheezes. She has no rales. She exhibits no tenderness.  Abdominal: Soft. Bowel sounds are normal. She exhibits no distension and no mass. There is no tenderness. There is no rebound and no guarding.  No focal abdominal pain or tenderness, no CVA tenderness  Musculoskeletal: Normal range of motion. She exhibits no edema and no tenderness.  Neurological: She is alert and oriented to person, place, and time.  Skin: Skin is warm  and dry.  Psychiatric: She has a normal mood and affect. Her behavior is normal. Judgment and thought content normal.    ED Course  Procedures (including critical care time)  Labs Reviewed  URINALYSIS, ROUTINE W REFLEX MICROSCOPIC  POCT PREGNANCY, URINE   Results for orders placed during the hospital encounter of 08/07/12  URINALYSIS, ROUTINE W REFLEX MICROSCOPIC      Result Value Range   Color, Urine YELLOW  YELLOW   APPearance CLOUDY (*) CLEAR   Specific Gravity, Urine 1.033 (*) 1.005 - 1.030   pH 6.0  5.0 - 8.0   Glucose, UA NEGATIVE  NEGATIVE mg/dL   Hgb urine dipstick NEGATIVE  NEGATIVE   Bilirubin Urine NEGATIVE  NEGATIVE   Ketones, ur NEGATIVE  NEGATIVE mg/dL   Protein, ur  NEGATIVE  NEGATIVE mg/dL   Urobilinogen, UA 0.2  0.0 - 1.0 mg/dL   Nitrite POSITIVE (*) NEGATIVE   Leukocytes, UA NEGATIVE  NEGATIVE  URINE MICROSCOPIC-ADD ON      Result Value Range   Squamous Epithelial / LPF FEW (*) RARE   WBC, UA 3-6  <3 WBC/hpf   Bacteria, UA MANY (*) RARE   Urine-Other MUCOUS PRESENT    POCT PREGNANCY, URINE      Result Value Range   Preg Test, Ur NEGATIVE  NEGATIVE       1. UTI (lower urinary tract infection)       MDM  22 year old female with uncomplicated UTI.  Will treat with Keflex.  Patient may continue AZO.  Return precautions given.  Patient is stable and ready for discharge.        Roxy Horseman, PA-C 08/07/12 2212

## 2012-08-08 NOTE — ED Provider Notes (Signed)
Medical screening examination/treatment/procedure(s) were performed by non-physician practitioner and as supervising physician I was immediately available for consultation/collaboration.  Raigen Jagielski L Leilanie Rauda, MD 08/08/12 0040 

## 2012-08-09 LAB — URINE CULTURE: Colony Count: >=100000

## 2012-08-10 ENCOUNTER — Telehealth (HOSPITAL_COMMUNITY): Payer: Self-pay | Admitting: Emergency Medicine

## 2012-08-10 NOTE — ED Notes (Signed)
Patient has +Urine culture. Checking to see if appropriately treated. °

## 2012-08-10 NOTE — ED Notes (Signed)
+  Urine. Patient treated with Keflex. Sensitive to same. Per protocol MD. °

## 2013-01-21 ENCOUNTER — Encounter: Payer: Self-pay | Admitting: Advanced Practice Midwife

## 2013-01-21 ENCOUNTER — Ambulatory Visit (INDEPENDENT_AMBULATORY_CARE_PROVIDER_SITE_OTHER): Payer: Medicaid Other | Admitting: Advanced Practice Midwife

## 2013-01-21 VITALS — BP 122/70 | HR 77 | Temp 98.3°F | Ht 66.0 in | Wt 169.0 lb

## 2013-01-21 DIAGNOSIS — Z01419 Encounter for gynecological examination (general) (routine) without abnormal findings: Secondary | ICD-10-CM

## 2013-01-21 DIAGNOSIS — Z9889 Other specified postprocedural states: Secondary | ICD-10-CM

## 2013-01-21 DIAGNOSIS — Z0000 Encounter for general adult medical examination without abnormal findings: Secondary | ICD-10-CM

## 2013-01-21 NOTE — Progress Notes (Signed)
Subjective:     Jodi Ryan is a 22 y.o. female here for a routine exam.  Current complaints: Patient is in the office for pap smear.  Personal health questionnaire reviewed: no.   Same sex relationship. Children are doing well.  Reports brown discharge 2 days prior to MP, resolved. Denies itching burning or abnormal discharge at this time.   Reports having routine lab work done in the near past that was WNL.   Patient had a sister who died @ age of 47, unexplained. Her sister received Gardasil 2 days prior to ger death, that is the family's only suspicion for cause of death. Patients other sister is alive and well.  Gynecologic History Patient's last menstrual period was 01/10/2013. Contraception: none Last Pap: 1 year. Results were: abnormal ( hx LEEP 2010)   Obstetric History OB History  Gravida Para Term Preterm AB SAB TAB Ectopic Multiple Living  2 2 2  0 0 0 0 0 0 2    # Outcome Date GA Lbr Len/2nd Weight Sex Delivery Anes PTL Lv  2 TRM 08/22/11 [redacted]w[redacted]d 09:02 / 00:25 8 lb 9.4 oz (3.895 kg) M SVD EPI  Y     Comments: WNL  1 TRM 04/2006 [redacted]w[redacted]d 20:00 8 lb 11 oz (3.941 kg) M SVD EPI N Y     Comments: anemia       The following portions of the patient's history were reviewed and updated as appropriate: allergies, current medications, past family history, past medical history, past social history, past surgical history and problem list.  Review of Systems A comprehensive review of systems was negative.    Objective:    BP 122/70  Pulse 77  Temp(Src) 98.3 F (36.8 C)  Ht 5\' 6"  (1.676 m)  Wt 169 lb (76.658 kg)  BMI 27.29 kg/m2  LMP 01/10/2013  Breastfeeding? No  General Appearance:    Alert, cooperative, no distress, appears stated age  Head:    Normocephalic, without obvious abnormality, atraumatic  Eyes:    PERRL, conjunctiva/corneas clear, EOM's intact, fundi    benign, both eyes  Ears:    Normal TM's and external ear canals, both ears  Nose:   Nares normal,  septum midline, mucosa normal, no drainage    or sinus tenderness  Throat:   Lips, mucosa, and tongue normal; teeth and gums normal  Neck:   Supple, symmetrical, trachea midline, no adenopathy;    thyroid:  no enlargement/tenderness/nodules; no carotid   bruit or JVD  Back:     Symmetric, no curvature, ROM normal, no CVA tenderness  Lungs:     Clear to auscultation bilaterally, respirations unlabored  Chest Wall:    No tenderness or deformity   Heart:    Regular rate and rhythm, S1 and S2 normal, no murmur, rub   or gallop  Breast Exam:    No tenderness, masses, or nipple abnormality  Abdomen:     Soft, non-tender, bowel sounds active all four quadrants,    no masses, no organomegaly  Genitalia:    Normal female without lesion, discharge or tenderness  Rectal:    Normal tone, normal prostate, no masses or tenderness;   guaiac negative stool  Extremities:   Extremities normal, atraumatic, no cyanosis or edema  Pulses:   2+ and symmetric all extremities  Skin:   Skin color, texture, turgor normal, no rashes or lesions  Lymph nodes:   Cervical, supraclavicular, and axillary nodes normal  Neurologic:   CNII-XII intact, normal strength, sensation  and reflexes    throughout      Assessment:    Healthy female exam.  Patient Active Problem List   Diagnosis Date Noted  . Vaginal delivery 2011-08-25   Sister died @ 9 yo, unexplained    Plan:    Education reviewed: calcium supplements, depression evaluation, low fat, low cholesterol diet, safe sex/STD prevention, self breast exams, skin cancer screening and weight bearing exercise. Follow up in: [facility] .Marland Kitchen     40 min spent with patient greater than 80% spent in counseling and coordination of care.   Kaisey Huseby Wilson Singer CNM

## 2013-01-21 NOTE — Addendum Note (Signed)
Addended by: Elby Beck F on: 01/21/2013 05:01 PM   Modules accepted: Orders

## 2013-01-22 LAB — PAP IG W/ RFLX HPV ASCU

## 2013-04-17 ENCOUNTER — Ambulatory Visit: Payer: Medicaid Other | Admitting: Obstetrics

## 2013-04-18 ENCOUNTER — Ambulatory Visit: Payer: Medicaid Other | Admitting: Obstetrics

## 2013-04-19 ENCOUNTER — Encounter (HOSPITAL_COMMUNITY): Payer: Self-pay | Admitting: Emergency Medicine

## 2013-04-19 ENCOUNTER — Emergency Department (HOSPITAL_COMMUNITY)
Admission: EM | Admit: 2013-04-19 | Discharge: 2013-04-19 | Disposition: A | Discharge status: Home / Self Care | Payer: Medicaid Other | Attending: Emergency Medicine | Admitting: Emergency Medicine

## 2013-04-19 DIAGNOSIS — F430 Acute stress reaction: Secondary | ICD-10-CM | POA: Insufficient documentation

## 2013-04-19 DIAGNOSIS — Z3202 Encounter for pregnancy test, result negative: Secondary | ICD-10-CM | POA: Insufficient documentation

## 2013-04-19 DIAGNOSIS — R5381 Other malaise: Secondary | ICD-10-CM | POA: Insufficient documentation

## 2013-04-19 DIAGNOSIS — Z862 Personal history of diseases of the blood and blood-forming organs and certain disorders involving the immune mechanism: Secondary | ICD-10-CM | POA: Insufficient documentation

## 2013-04-19 DIAGNOSIS — R531 Weakness: Secondary | ICD-10-CM

## 2013-04-19 DIAGNOSIS — Z79899 Other long term (current) drug therapy: Secondary | ICD-10-CM | POA: Insufficient documentation

## 2013-04-19 DIAGNOSIS — R42 Dizziness and giddiness: Secondary | ICD-10-CM | POA: Insufficient documentation

## 2013-04-19 DIAGNOSIS — Z8619 Personal history of other infectious and parasitic diseases: Secondary | ICD-10-CM | POA: Insufficient documentation

## 2013-04-19 DIAGNOSIS — R1110 Vomiting, unspecified: Secondary | ICD-10-CM | POA: Insufficient documentation

## 2013-04-19 DIAGNOSIS — R630 Anorexia: Secondary | ICD-10-CM | POA: Insufficient documentation

## 2013-04-19 LAB — COMPREHENSIVE METABOLIC PANEL
ALT: 10 U/L (ref 0–35)
AST: 16 U/L (ref 0–37)
Albumin: 4.2 g/dL (ref 3.5–5.2)
Alkaline Phosphatase: 51 U/L (ref 39–117)
Chloride: 101 mEq/L (ref 96–112)
Potassium: 4.1 mEq/L (ref 3.5–5.1)
Sodium: 135 mEq/L (ref 135–145)
Total Protein: 7.9 g/dL (ref 6.0–8.3)

## 2013-04-19 LAB — URINALYSIS, ROUTINE W REFLEX MICROSCOPIC
Glucose, UA: NEGATIVE mg/dL
Ketones, ur: NEGATIVE mg/dL
Nitrite: NEGATIVE
Protein, ur: NEGATIVE mg/dL
pH: 5.5 (ref 5.0–8.0)

## 2013-04-19 LAB — CBC WITH DIFFERENTIAL/PLATELET
Basophils Relative: 0 % (ref 0–1)
Eosinophils Absolute: 0 10*3/uL (ref 0.0–0.7)
MCH: 28.2 pg (ref 26.0–34.0)
MCHC: 34 g/dL (ref 30.0–36.0)
Neutro Abs: 5.5 10*3/uL (ref 1.7–7.7)
Neutrophils Relative %: 70 % (ref 43–77)
Platelets: 259 10*3/uL (ref 150–400)
RBC: 4.82 MIL/uL (ref 3.87–5.11)

## 2013-04-19 LAB — POCT PREGNANCY, URINE: Preg Test, Ur: NEGATIVE

## 2013-04-19 LAB — URINE MICROSCOPIC-ADD ON

## 2013-04-19 MED ORDER — ONDANSETRON 8 MG PO TBDP
8.0000 mg | ORAL_TABLET | Freq: Once | ORAL | Status: AC
  Administered 2013-04-19: 8 mg via ORAL
  Filled 2013-04-19: qty 1

## 2013-04-19 NOTE — ED Notes (Signed)
Pt c/o dizziness x several weeks, and vomiting x1 day.  Pt reports losing 10lbs within 1 week.  Denies any other symptoms.

## 2013-04-19 NOTE — ED Provider Notes (Signed)
CSN: [id number]     Arrival date & time 04/19/13  1141 History   First MD Initiated Contact with Patient 04/19/13 1210     Chief Complaint  Patient presents with  . Emesis  . Dizziness   (Consider location/radiation/quality/duration/timing/severity/associated sxs/prior Treatment) HPI Jodi Ryan is a 22 y.o. female who presents emergency department complaining of dizziness and weakness for several weeks. She states she lost approximately 10 pounds in the last week. States she has had decreased appetite and is not eating. She admits to a lot of stress. She states that this morning she did good breakfast but shortly after that began vomiting. States she had one episode of large or emesis. She denies any fever or chills. She denies any abdominal pain. She denies any recent illnesses or travel. She denies being pregnant. She did not take any medications prior to coming in.  Past Medical History  Diagnosis Date  . Anemia   . Abnormal Pap smear 2010    had LEEP  . Chlamydia    Past Surgical History  Procedure Laterality Date  . Leep     Family History  Problem Relation Age of Onset  . Hypertension Maternal Grandmother   . Diabetes Paternal Grandmother   . Hypotension Neg Hx   . Anesthesia problems Neg Hx   . Malignant hyperthermia Neg Hx   . Pseudochol deficiency Neg Hx    History  Substance Use Topics  . Smoking status: Never Smoker   . Smokeless tobacco: Never Used  . Alcohol Use: No   OB History   Grav Para Term Preterm Abortions TAB SAB Ect Mult Living   2 2 2  0 0 0 0 0 0 2     Review of Systems  Constitutional: Positive for fatigue. Negative for fever and chills.  Respiratory: Negative for cough, chest tightness and shortness of breath.   Cardiovascular: Negative for chest pain, palpitations and leg swelling.  Gastrointestinal: Positive for vomiting. Negative for nausea, abdominal pain and diarrhea.  Genitourinary: Negative for dysuria, flank pain, vaginal  bleeding, vaginal discharge, vaginal pain and pelvic pain.  Musculoskeletal: Negative for arthralgias, myalgias, neck pain and neck stiffness.  Skin: Negative for rash.  Neurological: Positive for weakness. Negative for dizziness and headaches.  All other systems reviewed and are negative.    Allergies  Review of patient's allergies indicates no known allergies.  Home Medications   Current Outpatient Rx  Name  Route  Sig  Dispense  Refill  . Biotin 3 MG TABS   Oral   Take 1 tablet by mouth daily.         . Multiple Vitamins-Calcium (ONE-A-DAY WOMENS PO)   Oral   Take 1 tablet by mouth daily.         . Probiotic Product (PROBIOTIC DAILY PO)   Oral   Take 1 tablet by mouth daily.          BP 139/96  Pulse 81  Temp(Src) 98.1 F (36.7 C) (Oral)  Resp 16  SpO2 95%  LMP 03/22/2013 Physical Exam  Nursing note and vitals reviewed. Constitutional: She is oriented to person, place, and time. She appears well-developed and well-nourished. No distress.  HENT:  Head: Normocephalic.  Eyes: Conjunctivae and EOM are normal. Pupils are equal, round, and reactive to light.  Neck: Neck supple.  Cardiovascular: Normal rate, regular rhythm and normal heart sounds.   Pulmonary/Chest: Effort normal and breath sounds normal. No respiratory distress. She has no wheezes. She has no rales.  Abdominal: Soft. Bowel sounds are normal. She exhibits no distension. There is no tenderness. There is no rebound.  Musculoskeletal: She exhibits no edema.  Neurological: She is alert and oriented to person, place, and time.  Skin: Skin is warm and dry.  Psychiatric: She has a normal mood and affect. Her behavior is normal.    ED Course  Procedures (including critical care time) Labs Review Labs Reviewed  URINALYSIS, ROUTINE W REFLEX MICROSCOPIC - Abnormal; Notable for the following:    APPearance TURBID (*)    Specific Gravity, Urine 1.038 (*)    Leukocytes, UA SMALL (*)    All other  components within normal limits  CBC WITH DIFFERENTIAL  COMPREHENSIVE METABOLIC PANEL  URINE MICROSCOPIC-ADD ON  POCT PREGNANCY, URINE   Imaging Review No results found.  EKG Interpretation   None       MDM   1. Weakness   2. Vomiting     Patient with dizziness and weakness for several weeks, vomited times one today. Patient admits that she had rather large breakfast which included eggs, muffin, salad, mimosa, among other things. Patient has not had any nausea or vomiting since then. She appears to be in no distress on my exam. She has no findings. Labs are all unremarkable. Will followup with primary care doctor.   Filed Vitals:   04/19/13 1154 04/19/13 1452  BP: 139/96 113/66  Pulse: 81 73  Temp: 98.1 F (36.7 C) 97.5 F (36.4 C)  TempSrc: Oral Oral  Resp: 16 18  SpO2: 95% 97%       Lottie Mussel, PA-C 04/19/13 1557

## 2013-04-19 NOTE — ED Provider Notes (Signed)
Medical screening examination/treatment/procedure(s) were performed by non-physician practitioner and as supervising physician I was immediately available for consultation/collaboration.  EKG Interpretation   None         Junius Argyle, MD 04/19/13 Rickey Primus

## 2013-08-26 ENCOUNTER — Ambulatory Visit (INDEPENDENT_AMBULATORY_CARE_PROVIDER_SITE_OTHER): Payer: Medicaid Other | Admitting: Obstetrics

## 2013-08-26 DIAGNOSIS — Z01419 Encounter for gynecological examination (general) (routine) without abnormal findings: Secondary | ICD-10-CM

## 2013-08-27 ENCOUNTER — Encounter: Payer: Self-pay | Admitting: Obstetrics

## 2013-08-27 NOTE — Progress Notes (Deleted)
Subjective:     Jodi Ryan is a 23 y.o. female here for a routine annual exam.  Current complaints: none.    Personal health questionnaire:  Is patient [name], have a family history of breast and/or ovarian cancer: no Is there a family history of uterine cancer diagnosed at age < 3450, gastrointestinal cancer, urinary tract cancer, family member who is a Personnel officerLynch syndrome-associated carrier: no Is the patient overweight and hypertensive, family history of diabetes, personal history of gestational diabetes or PCOS: no Is patient over 5755, have PCOS,  family history of premature CHD under age 25, diabetes, smoke, have hypertension or peripheral artery disease:  no  The HPI was reviewed and explored in further detail by the provider. Gynecologic History No LMP recorded. Contraception: none Last Pap: ***. Results were: {norm/abn:16337} Last mammogram: ***. Results were: {norm/abn:16337}  Obstetric History OB History  Gravida Para Term Preterm AB SAB TAB Ectopic Multiple Living  2 2 2  0 0 0 0 0 0 2    # Outcome Date GA Lbr Len/2nd Weight Sex Delivery Anes PTL Lv  2 TRM 08/22/11 116w0d 09:02 / 00:25 8 lb 9.4 oz (3.895 kg) M SVD EPI  Y     Comments: WNL  1 TRM 04/2006 3016w0d 20:00 8 lb 11 oz (3.941 kg) M SVD EPI N Y     Comments: anemia      Past Medical History  Diagnosis Date  . Anemia   . Abnormal Pap smear 2010    had LEEP  . Chlamydia     Past Surgical History  Procedure Laterality Date  . Leep      Current outpatient prescriptions:Biotin 3 MG TABS, Take 1 tablet by mouth daily., Disp: , Rfl: ;  Multiple Vitamins-Calcium (ONE-A-DAY WOMENS PO), Take 1 tablet by mouth daily., Disp: , Rfl: ;  Probiotic Product (PROBIOTIC DAILY PO), Take 1 tablet by mouth daily., Disp: , Rfl:  No Known Allergies  History  Substance Use Topics  . Smoking status: Never Smoker   . Smokeless tobacco: Never Used  . Alcohol Use: No    Family History  Problem Relation Age of Onset  .  Hypertension Maternal Grandmother   . Diabetes Paternal Grandmother   . Hypotension Neg Hx   . Anesthesia problems Neg Hx   . Malignant hyperthermia Neg Hx   . Pseudochol deficiency Neg Hx       Review of Systems  Constitutional: negative for fatigue and weight loss Respiratory: negative for cough and wheezing Cardiovascular: negative for chest pain, fatigue and palpitations Gastrointestinal: negative for abdominal pain and change in bowel habits Musculoskeletal:negative for myalgias Neurological: negative for gait problems and tremors Behavioral/Psych: negative for abusive relationship, depression Endocrine: negative for temperature intolerance   Genitourinary:negative for abnormal menstrual periods, genital lesions, hot flashes, sexual problems and vaginal discharge Integument/breast: negative for breast lump, breast tenderness, nipple discharge and skin lesion(s)    Objective:       General:   alert  Skin:   no rash or abnormalities  Lungs:   clear to auscultation bilaterally  Heart:   regular rate and rhythm, S1, S2 normal, no murmur, click, rub or gallop  Breasts:   normal without suspicious masses, skin or nipple changes or axillary nodes  Abdomen:  normal findings: no organomegaly, soft, non-tender and no hernia  Pelvis:  External genitalia: normal general appearance Urinary system: urethral meatus normal and bladder without fullness, nontender Vaginal: normal without tenderness, induration or masses Cervix: normal appearance  Adnexa: normal bimanual exam Uterus: anteverted and non-tender, normal size   Lab Review Urine pregnancy test Labs reviewed {YES NO:22349} Radiologic studies reviewed {YES NO:22349}  ***% of *** min visit spent on counseling and coordination of care.   Assessment:    Healthy female exam.    Plan:    {plan:19193}   Need to obtain previous records Possible management options include:*** Follow up as needed. ***

## 2013-08-28 NOTE — Progress Notes (Signed)
Patient rescheduled

## 2013-08-28 NOTE — Progress Notes (Deleted)
Subjective:     Jodi Ryan is a 23 y.o. female here for a routine exam.  Current complaints: None.    Personal health questionnaire:  Is patient [name], have a family history of breast and/or ovarian cancer: {YES NO:22349} Is there a family history of uterine cancer diagnosed at age < 7650, gastrointestinal cancer, urinary tract cancer, family member who is a Personnel officerLynch syndrome-associated carrier: {YES NO:22349} Is the patient overweight and hypertensive, family history of diabetes, personal history of gestational diabetes or PCOS: {YES NO:22349} Is patient over 2355, have PCOS,  family history of premature CHD under age 25, diabetes, smoke, have hypertension or peripheral artery disease:  {YES NO:22349}  The HPI was reviewed and explored in further detail by the provider. Gynecologic History No LMP recorded. Contraception: {method:5051} Last Pap: ***. Results were: {norm/abn:16337} Last mammogram: ***. Results were: {norm/abn:16337}  Obstetric History OB History  Gravida Para Term Preterm AB SAB TAB Ectopic Multiple Living  2 2 2  0 0 0 0 0 0 2    # Outcome Date GA Lbr Len/2nd Weight Sex Delivery Anes PTL Lv  2 TRM 08/22/11 7350w0d 09:02 / 00:25 8 lb 9.4 oz (3.895 kg) M SVD EPI  Y     Comments: WNL  1 TRM 04/2006 2650w0d 20:00 8 lb 11 oz (3.941 kg) M SVD EPI N Y     Comments: anemia      Past Medical History  Diagnosis Date  . Anemia   . Abnormal Pap smear 2010    had LEEP  . Chlamydia     Past Surgical History  Procedure Laterality Date  . Leep      Current outpatient prescriptions:Biotin 3 MG TABS, Take 1 tablet by mouth daily., Disp: , Rfl: ;  Multiple Vitamins-Calcium (ONE-A-DAY WOMENS PO), Take 1 tablet by mouth daily., Disp: , Rfl: ;  Probiotic Product (PROBIOTIC DAILY PO), Take 1 tablet by mouth daily., Disp: , Rfl:  No Known Allergies  History  Substance Use Topics  . Smoking status: Never Smoker   . Smokeless tobacco: Never Used  . Alcohol Use: No     Family History  Problem Relation Age of Onset  . Hypertension Maternal Grandmother   . Diabetes Paternal Grandmother   . Hypotension Neg Hx   . Anesthesia problems Neg Hx   . Malignant hyperthermia Neg Hx   . Pseudochol deficiency Neg Hx       Review of Systems  Constitutional: negative for fatigue and weight loss Respiratory: negative for cough and wheezing Cardiovascular: negative for chest pain, fatigue and palpitations Gastrointestinal: negative for abdominal pain and change in bowel habits Musculoskeletal:negative for myalgias Neurological: negative for gait problems and tremors Behavioral/Psych: negative for abusive relationship, depression Endocrine: negative for temperature intolerance   Genitourinary:negative for abnormal menstrual periods, genital lesions, hot flashes, sexual problems and vaginal discharge Integument/breast: negative for breast lump, breast tenderness, nipple discharge and skin lesion(s)    Objective:       General:   alert  Skin:   no rash or abnormalities  Lungs:   clear to auscultation bilaterally  Heart:   regular rate and rhythm, S1, S2 normal, no murmur, click, rub or gallop  Breasts:   normal without suspicious masses, skin or nipple changes or axillary nodes  Abdomen:  normal findings: no organomegaly, soft, non-tender and no hernia  Pelvis:  External genitalia: normal general appearance Urinary system: urethral meatus normal and bladder without fullness, nontender Vaginal: normal without tenderness, induration or masses  Cervix: normal appearance Adnexa: normal bimanual exam Uterus: anteverted and non-tender, normal size   Lab Review Urine pregnancy test Labs reviewed {YES NO:22349} Radiologic studies reviewed {YES NO:22349}  ***% of *** min visit spent on counseling and coordination of care.   Assessment:    Healthy female exam.    Plan:    {plan:19193}   No orders of the defined types were placed in this encounter.   No  orders of the defined types were placed in this encounter.   Need to obtain previous records Possible management options include:*** Follow up as needed. ***

## 2013-12-16 IMAGING — US US OB DETAIL+14 WK
1 series · 12 of 28 positions shown · non-contrast
Comparison: none

[Series 1: us ob detail +14 wk · 12 of 48 slices shown]
[im 2/48]
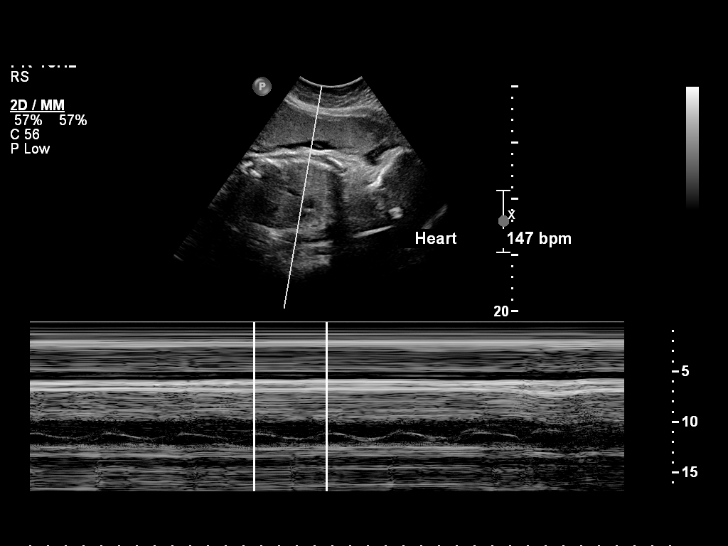
[im 6/48]
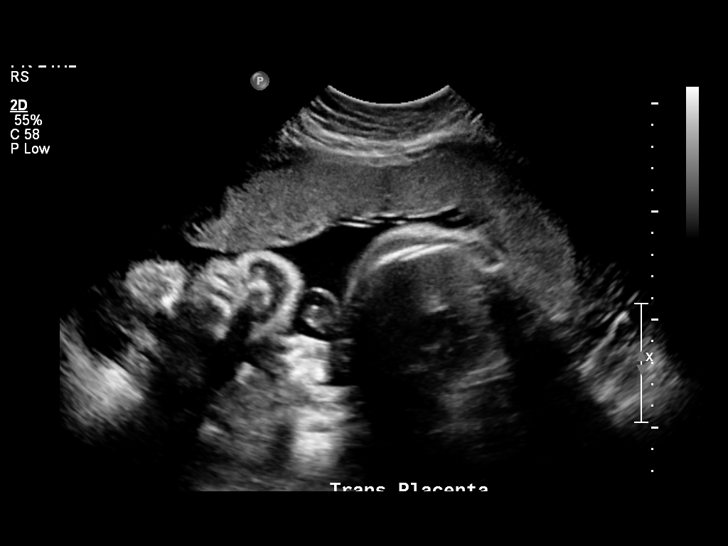
[im 9/48]
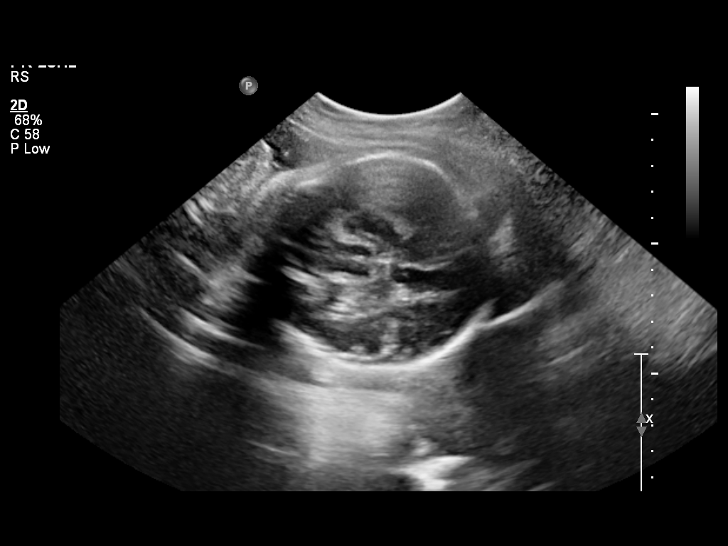
[im 14/48]
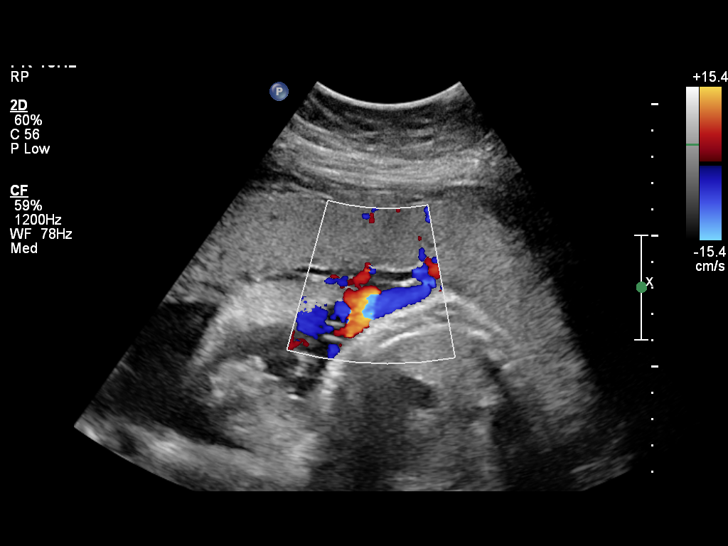
[im 18/48]
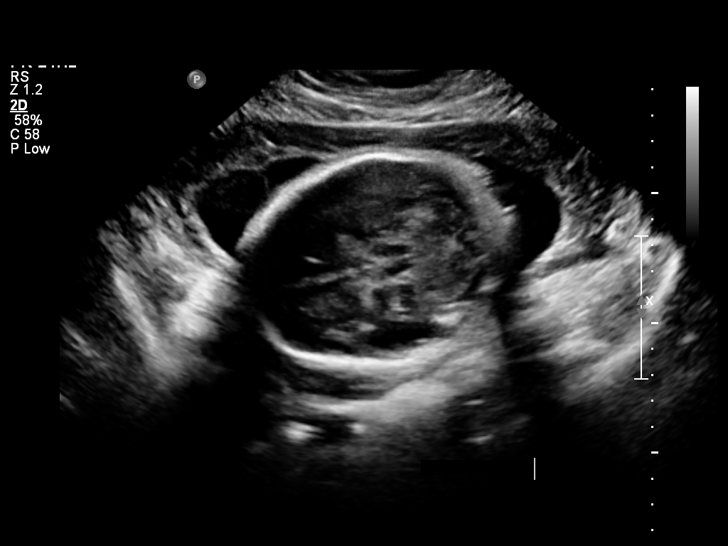
[im 21/48]
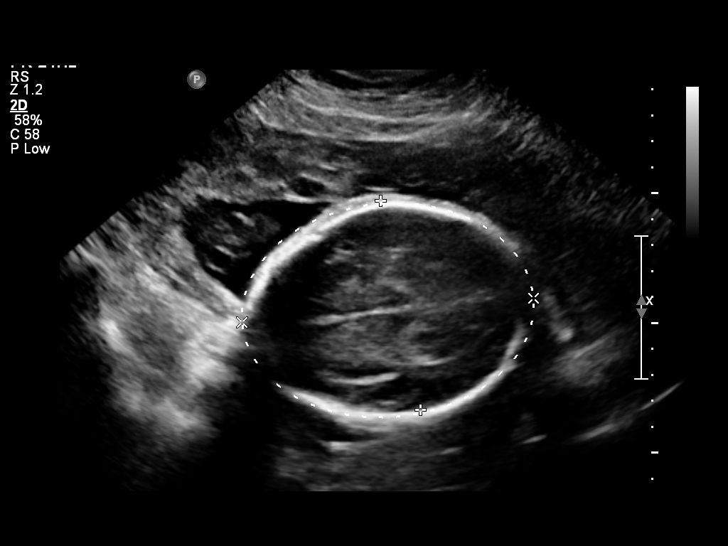
[im 27/48]
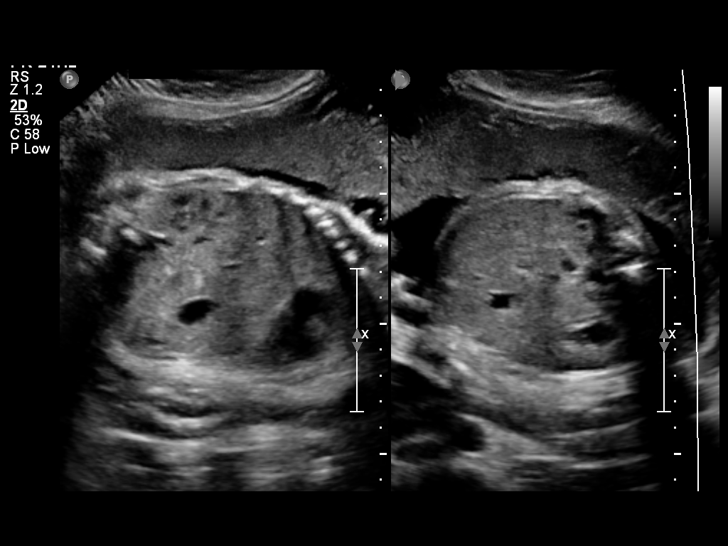
[im 30/48]
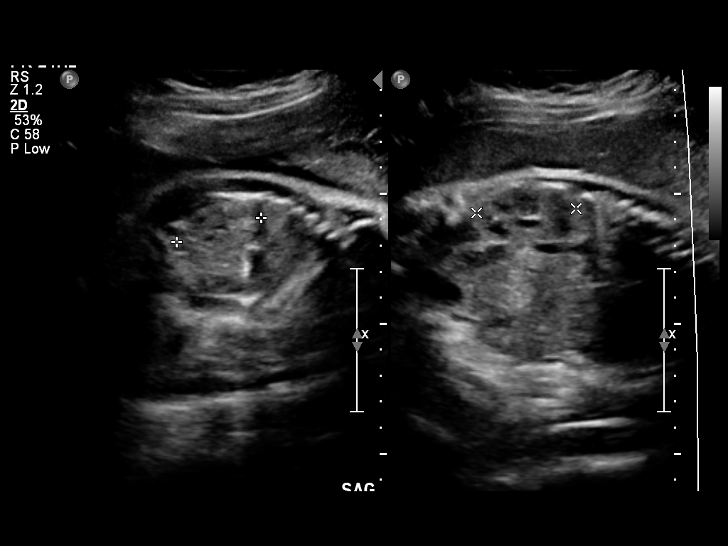
[im 34/48]
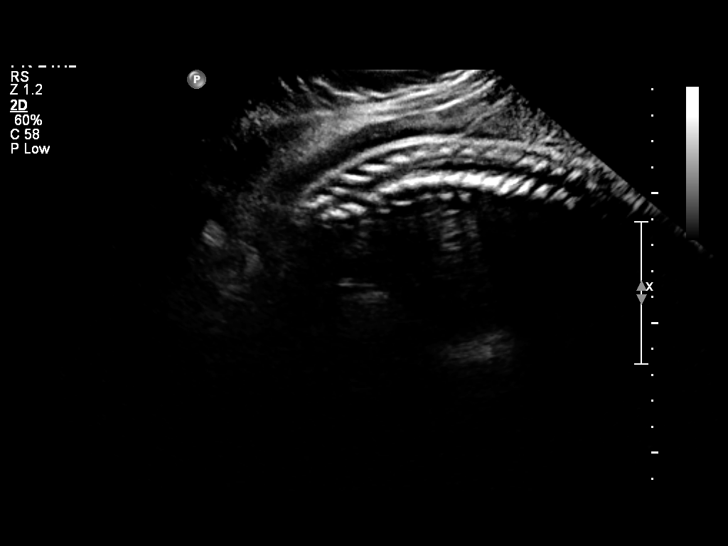
[im 39/48]
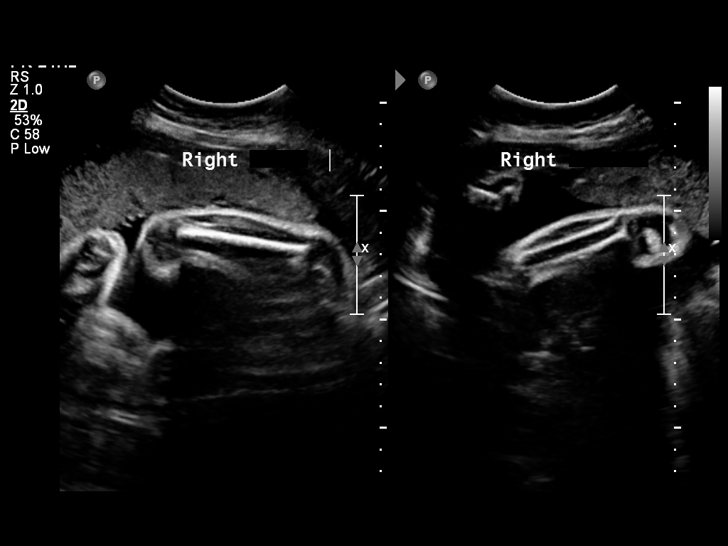
[im 42/48]
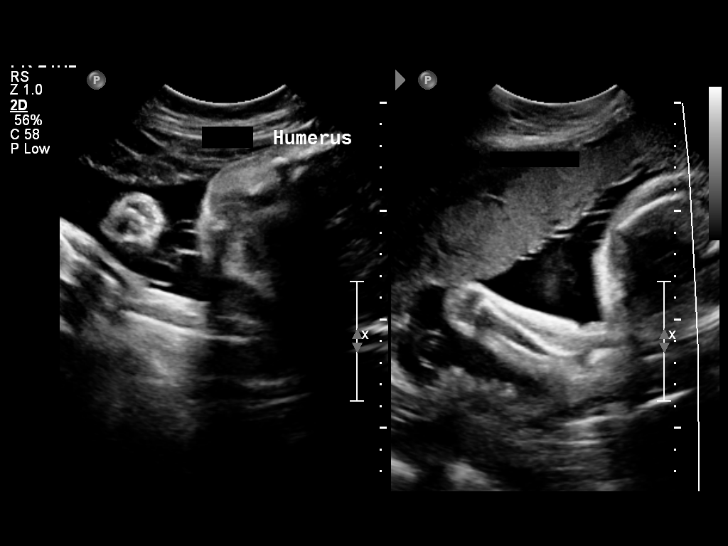
[im 46/48]
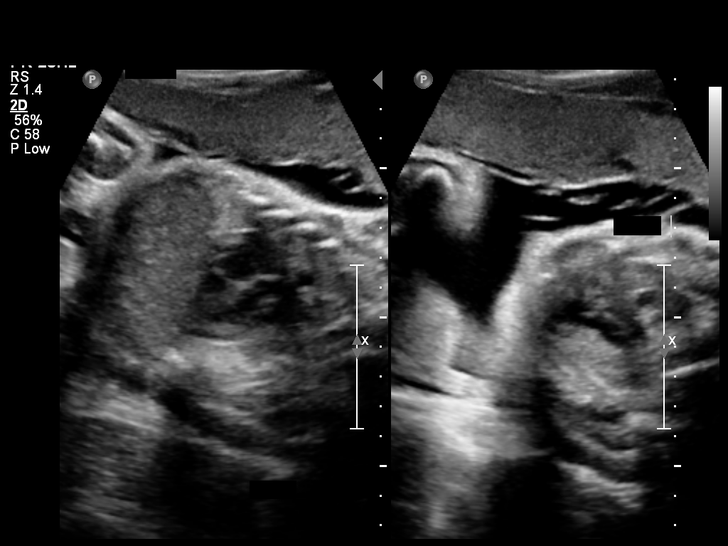

[12 of 28 positions shown; findings below may reference images not displayed]

OBSTETRICS REPORT
                      (Signed Final 06/23/2011 [DATE])

 Order#:         [id number]
Procedures

 US OB DETAIL + 14 WK                                  76811.0
Indications

 Detailed fetal anatomic survey
 Transfer of care to [REDACTED]
Fetal Evaluation

 Fetal Heart Rate:  147                         bpm
 Cardiac Activity:  Observed
 Presentation:      Cephalic
 Placenta:          Anterior, above cervical os
 P. Cord            Visualized
 Insertion:

 Amniotic Fluid
 AFI FV:      Subjectively within normal limits
 AFI Sum:     15.68   cm      56   %Tile     Larg Pckt:     5.5  cm
 RUQ:   5.5    cm    RLQ:    3.55   cm    LUQ:   3.01    cm   LLQ:    3.62   cm
Biometry

 BPD:     81.7  mm    G. Age:   32w 6d                CI:        70.35   70 - 86
                                                      FL/HC:      20.1   19.9 -

 HC:     310.6  mm    G. Age:   34w 5d       70  %    HC/AC:      1.11   0.96 -

 AC:     278.7  mm    G. Age:   32w 0d       31  %    FL/BPD:     76.4   71 - 87
 FL:      62.4  mm    G. Age:   32w 2d       31  %    FL/AC:      22.4   20 - 24
 HUM:     55.8  mm    G. Age:   32w 3d       53  %

 Est. FW:    8516  gm      4 lb 5 oz     54  %
Gestational Age

 LMP:           32w 4d       Date:   11/07/10                 EDD:   08/14/11
 U/S Today:     33w 0d                                        EDD:   08/11/11
 Best:          32w 4d    Det. By:   LMP  (11/07/10)          EDD:   08/14/11
Anatomy
 Cranium:           Appears normal      Aortic Arch:       Not well
                                                           visualized
 Fetal Cavum:       Appears normal      Ductal Arch:       Not well
                                                           visualized
 Ventricles:        Appears normal      Diaphragm:         Appears normal
 Choroid Plexus:    Appears normal      Stomach:           Appears
                                                           normal, left
                                                           sided
 Cerebellum:        Appears normal      Abdomen:           Appears normal
 Posterior Fossa:   Appears normal      Abdominal Wall:    Appears nml
                                                           (cord insert,
                                                           abd wall)
 Nuchal Fold:       Not applicable      Cord Vessels:      Appears normal
                    (>20 wks GA)                           (3 vessel cord)
 Face:              Lips appear         Kidneys:           Appear normal
                    normal
 Heart:             Appears normal      Bladder:           Appears normal
                    (4 chamber &
                    axis)
 RVOT:              Appears normal      Spine:             Appears normal
 LVOT:              Appears normal      Limbs:             Appears normal
                                                           (hands, ankles,
                                                           feet)

 Other:     Male gender. Heels and 5th digit visualized. Technically
            difficult due to advanced GA and fetal position.
Cervix Uterus Adnexa

 Cervical Length:   3.26      cm

 Cervix:       Closed.

 Adnexa:     No abnormality visualized.
Impression

 Single living intrauterine pregnancy in cephalic presentation.
 The estimated gestational age is 32w 4d based on LMP
 (11/07/10). No fetal anomalies are identified. Anatomic
 survey is limited by maternal body habitus and gestational
 age. Amniotic fluid index is 15.68 cm.

## 2014-01-04 ENCOUNTER — Emergency Department (HOSPITAL_COMMUNITY)
Admission: EM | Admit: 2014-01-04 | Discharge: 2014-01-04 | Disposition: A | Discharge status: Home / Self Care | Payer: Medicaid Other | Attending: Emergency Medicine | Admitting: Emergency Medicine

## 2014-01-04 ENCOUNTER — Encounter (HOSPITAL_COMMUNITY): Payer: Self-pay | Admitting: Emergency Medicine

## 2014-01-04 DIAGNOSIS — Z79899 Other long term (current) drug therapy: Secondary | ICD-10-CM | POA: Diagnosis not present

## 2014-01-04 DIAGNOSIS — IMO0001 Reserved for inherently not codable concepts without codable children: Secondary | ICD-10-CM | POA: Insufficient documentation

## 2014-01-04 DIAGNOSIS — J988 Other specified respiratory disorders: Secondary | ICD-10-CM

## 2014-01-04 DIAGNOSIS — Z8619 Personal history of other infectious and parasitic diseases: Secondary | ICD-10-CM | POA: Diagnosis not present

## 2014-01-04 DIAGNOSIS — J029 Acute pharyngitis, unspecified: Secondary | ICD-10-CM | POA: Insufficient documentation

## 2014-01-04 DIAGNOSIS — Z862 Personal history of diseases of the blood and blood-forming organs and certain disorders involving the immune mechanism: Secondary | ICD-10-CM | POA: Insufficient documentation

## 2014-01-04 DIAGNOSIS — J069 Acute upper respiratory infection, unspecified: Secondary | ICD-10-CM | POA: Diagnosis not present

## 2014-01-04 DIAGNOSIS — B9789 Other viral agents as the cause of diseases classified elsewhere: Secondary | ICD-10-CM

## 2014-01-04 NOTE — Discharge Instructions (Signed)
Use saline nasal spray one squirt in each nostril every 2 or 3 hours while awake to help with congestion. Take Tylenol as needed for discomfort. See your doctor at [facility] family practice if not improved in a week

## 2014-01-04 NOTE — ED Provider Notes (Signed)
CSN: [id number]     Arrival date & time 01/04/14  7829 History   First MD Initiated Contact with Patient 01/04/14 [phone]     Chief Complaint  Patient presents with  . Facial Pain  . Sore Throat  . Headache  . Generalized Body Aches     (Consider location/radiation/quality/duration/timing/severity/associated sxs/prior Treatment) HPI Complains of sneeze cough nasal congestion mild diffuse myalgias onset 4 days ago. Admits to slight scratchy throat. No other associated symptoms. Treated with DayQuil NyQuil and Tylenol, without relief no fever. Nothing makes symptoms better or worse.no watery eyes. Symptoms mild at present Past Medical History  Diagnosis Date  . Anemia   . Abnormal Pap smear 2010    had LEEP  . Chlamydia    Past Surgical History  Procedure Laterality Date  . Leep     Family History  Problem Relation Age of Onset  . Hypertension Maternal Grandmother   . Diabetes Paternal Grandmother   . Hypotension Neg Hx   . Anesthesia problems Neg Hx   . Malignant hyperthermia Neg Hx   . Pseudochol deficiency Neg Hx    History  Substance Use Topics  . Smoking status: Never Smoker   . Smokeless tobacco: Never Used  . Alcohol Use: No   OB History   Grav Para Term Preterm Abortions TAB SAB Ect Mult Living   0 0 0 0 0 0 2     Review of Systems  Constitutional: Negative.   HENT: Positive for congestion, sinus pressure and sneezing.   Respiratory: Positive for cough.   Cardiovascular: Negative.   Gastrointestinal: Negative.   Musculoskeletal: Positive for myalgias.  Skin: Negative.   Neurological: Negative.   Psychiatric/Behavioral: Negative.   All other systems reviewed and are negative.     Allergies  Review of patient's allergies indicates no known allergies.  Home Medications   Prior to Admission medications   Medication Sig Start Date End Date Taking? Authorizing Provider  Biotin 3 MG TABS Take 1 tablet by mouth daily.    Historical Provider, MD   Multiple Vitamins-Calcium (ONE-A-DAY WOMENS PO) Take 1 tablet by mouth daily.    Historical Provider, MD  Probiotic Product (PROBIOTIC DAILY PO) Take 1 tablet by mouth daily.    Historical Provider, MD   BP 125/63  Pulse 92  Temp(Src) 99.1 F (37.3 C) (Oral)  Resp 18  SpO2 100%  LMP 12/09/2013 Physical Exam  Nursing note and vitals reviewed. Constitutional: She appears well-developed and well-nourished.  HENT:  Head: Normocephalic and atraumatic.  Right Ear: External ear normal.  Left Ear: External ear normal.  Oropharynx normal. Bilateral tympanic membranes normal. Positive nasal congestion  Eyes: Conjunctivae are normal. Pupils are equal, round, and reactive to light.  Neck: Neck supple. No tracheal deviation present. No thyromegaly present.  Cardiovascular: Normal rate and regular rhythm.   No murmur heard. Pulmonary/Chest: Effort normal and breath sounds normal.  Abdominal: Soft. Bowel sounds are normal. She exhibits no distension. There is no tenderness.  Musculoskeletal: Normal range of motion. She exhibits no edema and no tenderness.  Lymphadenopathy:    She has no cervical adenopathy.  Neurological: She is alert. Coordination normal.  Skin: Skin is warm and dry. No rash noted.  Psychiatric: She has a normal mood and affect.    ED Course  Procedures (including critical care time) Labs Review Labs Reviewed - No data to display  Imaging Review No results found.   EKG Interpretation None  MDM  Symptoms and exam consistent with viral respiratory illness. Plan Tylenol, saline nasal spray Followup PMD if not improved one week Final diagnoses:  None  Dx : viral respiratory illness      Doug Sou, MD 01/04/14 1002

## 2014-01-04 NOTE — ED Notes (Signed)
Pt c/o right side facial pain, watery right eye, sore throat, intermittent headache and generalized body aches x 3 days. Denies n/v/d.

## 2014-01-26 ENCOUNTER — Encounter: Payer: Self-pay | Admitting: Obstetrics

## 2014-01-26 ENCOUNTER — Ambulatory Visit (INDEPENDENT_AMBULATORY_CARE_PROVIDER_SITE_OTHER): Payer: Medicaid Other | Admitting: Obstetrics

## 2014-01-26 VITALS — BP 121/67 | HR 70 | Ht 66.0 in | Wt 132.0 lb

## 2014-01-26 DIAGNOSIS — Z0000 Encounter for general adult medical examination without abnormal findings: Secondary | ICD-10-CM

## 2014-01-26 DIAGNOSIS — Z3202 Encounter for pregnancy test, result negative: Secondary | ICD-10-CM

## 2014-01-26 NOTE — Progress Notes (Signed)
Subjective:     Jodi Ryan is a 23 y.o. female here for a routine exam.  Current complaints: none.    Personal health questionnaire:  Is patient [name], have a family history of breast and/or ovarian cancer: no Is there a family history of uterine cancer diagnosed at age < 84, gastrointestinal cancer, urinary tract cancer, family member who is a Personnel officer syndrome-associated carrier: no Is the patient overweight and hypertensive, family history of diabetes, personal history of gestational diabetes or PCOS: no Is patient over 74, have PCOS,  family history of premature CHD under age 30, diabetes, smoke, have hypertension or peripheral artery disease:  no At any time, has a partner hit, kicked or otherwise hurt or frightened you?: no Over the past 2 weeks, have you felt down, depressed or hopeless?: no Over the past 2 weeks, have you felt little interest or pleasure in doing things?:no   Gynecologic History Patient's last menstrual period was 12/09/2013. Contraception: none Last Pap: 2014. Results were: normal Last mammogram: n/a. Results were: normal  Obstetric History OB History  Gravida Para Term Preterm AB SAB TAB Ectopic Multiple Living  0 0 0 0 0 0 2    # Outcome Date GA Lbr Len/2nd Weight Sex Delivery Anes PTL Lv  2 TRM 08/22/11 [redacted]w[redacted]d 09:02 / 00:25 8 lb 9.4 oz (3.895 kg) M SVD EPI  Y     Comments: WNL  1 TRM 04/2006 [redacted]w[redacted]d 20:00 8 lb 11 oz (3.941 kg) M SVD EPI N Y     Comments: anemia      Past Medical History  Diagnosis Date  . Anemia   . Abnormal Pap smear 2010    had LEEP  . Chlamydia     Past Surgical History  Procedure Laterality Date  . Leep      Current outpatient prescriptions:acetaminophen (TYLENOL) 500 MG tablet, Take 500 mg by mouth every 6 (six) hours as needed for mild pain., Disp: , Rfl:  No Known Allergies  History  Substance Use Topics  . Smoking status: Never Smoker   . Smokeless tobacco: Never Used  . Alcohol Use: Yes   Comment: occasionally     Family History  Problem Relation Age of Onset  . Hypertension Maternal Grandmother   . Diabetes Paternal Grandmother   . Hypotension Neg Hx   . Anesthesia problems Neg Hx   . Malignant hyperthermia Neg Hx   . Pseudochol deficiency Neg Hx       Review of Systems  Constitutional: negative for fatigue and weight loss Respiratory: negative for cough and wheezing Cardiovascular: negative for chest pain, fatigue and palpitations Gastrointestinal: negative for abdominal pain and change in bowel habits Musculoskeletal:negative for myalgias Neurological: negative for gait problems and tremors Behavioral/Psych: negative for abusive relationship, depression Endocrine: negative for temperature intolerance   Genitourinary:negative for abnormal menstrual periods, genital lesions, hot flashes, sexual problems and vaginal discharge Integument/breast: negative for breast lump, breast tenderness, nipple discharge and skin lesion(s)    Objective:       BP 121/67  Pulse 70  Ht  (1.676 m)  Wt 132 lb (59.875 kg)  BMI 21.32 kg/m2  LMP 12/09/2013 General:   alert  Skin:   no rash or abnormalities  Lungs:   clear to auscultation bilaterally  Heart:   regular rate and rhythm, S1, S2 normal, no murmur, click, rub or gallop  Breasts:   normal without suspicious masses, skin or nipple changes or axillary nodes  Abdomen:  normal findings: no organomegaly, soft, non-tender and no hernia  Pelvis:  External genitalia: normal general appearance Urinary system: urethral meatus normal and bladder without fullness, nontender Vaginal: normal without tenderness, induration or masses Cervix: normal appearance Adnexa: normal bimanual exam Uterus: anteverted and non-tender, normal size   Lab Review Urine pregnancy test Labs reviewed yes Radiologic studies reviewed no    Assessment:    Healthy female exam.    Plan:    Education reviewed: low fat, low cholesterol  diet, self breast exams and weight bearing exercise. Contraception: none. Follow up in: 1 year.   No orders of the defined types were placed in this encounter.   No orders of the defined types were placed in this encounter.

## 2014-01-26 NOTE — Addendum Note (Signed)
Addended by: Marya Landry D on: 01/26/2014 05:21 PM   Modules accepted: Orders

## 2014-01-27 LAB — PAP IG W/ RFLX HPV ASCU

## 2014-01-27 LAB — WET PREP BY MOLECULAR PROBE
Candida species: NEGATIVE
Gardnerella vaginalis: NEGATIVE
Trichomonas vaginosis: NEGATIVE

## 2014-02-05 LAB — POCT URINE PREGNANCY: Preg Test, Ur: NEGATIVE

## 2014-02-05 NOTE — Addendum Note (Signed)
Addended by: Henriette CombsHATTON, Ameliyah Sarno L on: 02/05/2014 06:55 PM   Modules accepted: Orders

## 2014-03-09 ENCOUNTER — Encounter: Payer: Self-pay | Admitting: Obstetrics

## 2015-02-02 ENCOUNTER — Emergency Department (HOSPITAL_COMMUNITY)
Admission: EM | Admit: 2015-02-02 | Discharge: 2015-02-02 | Disposition: A | Discharge status: Home / Self Care | Payer: Medicaid Other | Attending: Emergency Medicine | Admitting: Emergency Medicine

## 2015-02-02 ENCOUNTER — Encounter (HOSPITAL_COMMUNITY): Payer: Self-pay | Admitting: Emergency Medicine

## 2015-02-02 DIAGNOSIS — Z8619 Personal history of other infectious and parasitic diseases: Secondary | ICD-10-CM | POA: Insufficient documentation

## 2015-02-02 DIAGNOSIS — Z862 Personal history of diseases of the blood and blood-forming organs and certain disorders involving the immune mechanism: Secondary | ICD-10-CM | POA: Insufficient documentation

## 2015-02-02 DIAGNOSIS — H9202 Otalgia, left ear: Secondary | ICD-10-CM | POA: Diagnosis present

## 2015-02-02 DIAGNOSIS — H6092 Unspecified otitis externa, left ear: Secondary | ICD-10-CM | POA: Insufficient documentation

## 2015-02-02 DIAGNOSIS — J3489 Other specified disorders of nose and nasal sinuses: Secondary | ICD-10-CM | POA: Diagnosis not present

## 2015-02-02 MED ORDER — NEOMYCIN-COLIST-HC-THONZONIUM 3.3-3-10-0.5 MG/ML OT SUSP
3.0000 [drp] | OTIC | Status: AC
  Administered 2015-02-02: 3 [drp] via OTIC
  Filled 2015-02-02: qty 5

## 2015-02-02 NOTE — Discharge Instructions (Signed)
You have been give Cortisporin Otic drops use as follows  3 drops in to Left ear 3 times daily for 3 days

## 2015-02-02 NOTE — ED Provider Notes (Signed)
CSN: [id number]     Arrival date & time 02/02/15  0401 History   First MD Initiated Contact with Patient 02/02/15 [phone]     Chief Complaint  Patient presents with  . Otalgia     (Consider location/radiation/quality/duration/timing/severity/associated sxs/prior Treatment) Patient is a 24 y.o. female presenting with ear pain. The history is provided by the patient.  Otalgia Location:  Left Behind ear:  No abnormality Quality:  Aching Severity:  Mild Onset quality:  Gradual Duration:  2 days Timing:  Constant Progression:  Worsening Chronicity:  New Context: not direct blow, not elevation change, not foreign body in ear, not loud noise and no water in ear   Relieved by:  Nothing Worsened by:  Palpation Ineffective treatments:  OTC medications Associated symptoms: congestion and rhinorrhea   Associated symptoms: no cough, no ear discharge, no fever, no headaches, no neck pain and no sore throat     Past Medical History  Diagnosis Date  . Anemia   . Abnormal Pap smear 2010    had LEEP  . Chlamydia    Past Surgical History  Procedure Laterality Date  . Leep     Family History  Problem Relation Age of Onset  . Hypertension Maternal Grandmother   . Diabetes Paternal Grandmother   . Hypotension Neg Hx   . Anesthesia problems Neg Hx   . Malignant hyperthermia Neg Hx   . Pseudochol deficiency Neg Hx    Social History  Substance Use Topics  . Smoking status: Never Smoker   . Smokeless tobacco: Never Used  . Alcohol Use: Yes     Comment: occasionally    OB History    Gravida Para Term Preterm AB TAB SAB Ectopic Multiple Living   0 0 0 0 0 0 2     Review of Systems  Constitutional: Negative for fever.  HENT: Positive for congestion, ear pain and rhinorrhea. Negative for ear discharge and sore throat.   Respiratory: Negative for cough.   Musculoskeletal: Negative for neck pain.  Neurological: Negative for headaches.  All other systems reviewed and are  negative.     Allergies  Review of patient's allergies indicates no known allergies.  Home Medications   Prior to Admission medications   Medication Sig Start Date End Date Taking? Authorizing Provider  acetaminophen (TYLENOL) 500 MG tablet Take 500 mg by mouth every 6 (six) hours as needed for mild pain.    Historical Provider, MD   BP 128/81 mmHg  Pulse 94  Temp(Src) 98.2 F (36.8 C) (Oral)  Resp 18  Ht  (1.676 m)  Wt 155 lb (70.308 kg)  BMI 25.03 kg/m2  SpO2 100%  LMP 01/26/2015 (Exact Date) Physical Exam  Constitutional: She appears well-developed and well-nourished.  HENT:  Head: Normocephalic and atraumatic.  Right Ear: External ear normal.  Left Ear: There is swelling. No tenderness. Tympanic membrane is not erythematous and not bulging. Tympanic membrane mobility is normal.  Neck: Normal range of motion.  Cardiovascular: Normal rate and regular rhythm.   Pulmonary/Chest: Effort normal and breath sounds normal.  Abdominal: Soft.  Musculoskeletal: Normal range of motion.  Lymphadenopathy:    She has no cervical adenopathy.  Neurological: She is alert.  Skin: Skin is warm and dry.  Nursing note and vitals reviewed.   ED Course  Procedures (including critical care time) Labs Review Labs Reviewed - No data to display  Imaging Review No results found. I have personally reviewed and evaluated these  images and lab results as part of my medical decision-making.   EKG Interpretation None    patient has been give bottle of Cortisporin Otic with instruction for use   MDM   Final diagnoses:  Otitis externa, left         Earley Favor, NP 02/02/15 4098  Tomasita Crumble, MD 02/02/15 1191

## 2015-02-02 NOTE — ED Notes (Signed)
Pt states she has pain in her left ear that started as a dull pain on Sunday but has progressively gotten worse today  Pt states now it feels like a sharp pain when she swallows, burps, etc  Pt states she has had cold like sxs lately

## 2015-07-20 ENCOUNTER — Ambulatory Visit: Payer: Medicaid Other | Admitting: Obstetrics

## 2015-09-22 ENCOUNTER — Encounter: Payer: Self-pay | Admitting: Obstetrics

## 2015-09-22 ENCOUNTER — Ambulatory Visit (INDEPENDENT_AMBULATORY_CARE_PROVIDER_SITE_OTHER): Payer: Medicaid Other | Admitting: Certified Nurse Midwife

## 2015-09-22 VITALS — BP 132/73 | HR 105 | Temp 98.9°F | Wt 156.0 lb

## 2015-09-22 DIAGNOSIS — Z1389 Encounter for screening for other disorder: Secondary | ICD-10-CM | POA: Diagnosis not present

## 2015-09-22 DIAGNOSIS — Z0000 Encounter for general adult medical examination without abnormal findings: Secondary | ICD-10-CM

## 2015-09-22 DIAGNOSIS — Z01419 Encounter for gynecological examination (general) (routine) without abnormal findings: Secondary | ICD-10-CM

## 2015-09-22 DIAGNOSIS — Z113 Encounter for screening for infections with a predominantly sexual mode of transmission: Secondary | ICD-10-CM

## 2015-09-22 LAB — POCT URINALYSIS DIPSTICK
Bilirubin, UA: NEGATIVE
GLUCOSE UA: NEGATIVE
Ketones, UA: NEGATIVE
LEUKOCYTES UA: NEGATIVE
NITRITE UA: NEGATIVE
Protein, UA: NEGATIVE
RBC UA: NEGATIVE
Spec Grav, UA: 1.025
UROBILINOGEN UA: NEGATIVE
pH, UA: 5

## 2015-09-22 NOTE — Progress Notes (Signed)
Patient ID: Jodi Ryan, female   DOB: Jun 24, 1990, 25 y.o.   MRN: [id number]    Subjective:        Jodi Ryan is a 25 y.o. female here for a routine exam.  Current complaints: none.  Periods are regular.  Homosexual relationship.  Reports heavier white discharge.  Desires full STD screening exam.     Personal health questionnaire:  Is patient [name], have a family history of breast and/or ovarian cancer: Yes, PGM Is there a family history of uterine cancer diagnosed at age < 59, gastrointestinal cancer, urinary tract cancer, family member who is a Personnel officer syndrome-associated carrier: no Is the patient overweight and hypertensive, family history of diabetes, personal history of gestational diabetes, preeclampsia or PCOS: no Is patient over 55, have PCOS,  family history of premature CHD under age 64, diabetes, smoke, have hypertension or peripheral artery disease:  yes At any time, has a partner hit, kicked or otherwise hurt or frightened you?: no Over the past 2 weeks, have you felt down, depressed or hopeless?: occasionally depressed, denies any homicidal/suicidal thoughts Over the past 2 weeks, have you felt little interest or pleasure in doing things?:no   Gynecologic History No LMP recorded. Contraception: none Last Pap: 01/26/14. Results were: normal Last mammogram: N/A  Obstetric History OB History  Gravida Para Term Preterm AB SAB TAB Ectopic Multiple Living  0 0 0 0 0 0 2    # Outcome Date GA Lbr Len/2nd Weight Sex Delivery Anes PTL Lv  2 Term 08/22/11 [redacted]w[redacted]d 09:02 / 00:25 8 lb 9.4 oz (3.895 kg) M Vag-Spont EPI  Y     Comments: WNL  1 Term 04/2006 [redacted]w[redacted]d 20:00 8 lb 11 oz (3.941 kg) M Vag-Spont EPI N Y     Comments: anemia      Past Medical History  Diagnosis Date  . Anemia   . Abnormal Pap smear 2010    had LEEP  . Chlamydia     Past Surgical History  Procedure Laterality Date  . Leep       Current outpatient prescriptions:  .   acetaminophen (TYLENOL) 500 MG tablet, Take 500 mg by mouth every 6 (six) hours as needed for mild pain., Disp: , Rfl:  No Known Allergies  Social History  Substance Use Topics  . Smoking status: Never Smoker   . Smokeless tobacco: Never Used  . Alcohol Use: Yes     Comment: occasionally     Family History  Problem Relation Age of Onset  . Hypertension Maternal Grandmother   . Diabetes Paternal Grandmother   . Hypotension Neg Hx   . Anesthesia problems Neg Hx   . Malignant hyperthermia Neg Hx   . Pseudochol deficiency Neg Hx       Review of Systems  Constitutional: negative for fatigue and weight loss Respiratory: negative for cough and wheezing Cardiovascular: negative for chest pain, fatigue and palpitations Gastrointestinal: negative for abdominal pain and change in bowel habits Musculoskeletal:negative for myalgias Neurological: negative for gait problems and tremors Behavioral/Psych: negative for abusive relationship, depression Endocrine: negative for temperature intolerance   Genitourinary:negative for abnormal menstrual periods, genital lesions, hot flashes, sexual problems and vaginal discharge Integument/breast: negative for breast lump, breast tenderness, nipple discharge and skin lesion(s)    Objective:       BP 132/73 mmHg  Pulse 105  Temp(Src) 98.9 F (37.2 C)  Wt 156 lb (70.761 kg) General:   alert  Skin:   no  rash or abnormalities  Lungs:   clear to auscultation bilaterally  Heart:   regular rate and rhythm, S1, S2 normal, no murmur, click, rub or gallop  Breasts:   normal without suspicious masses, skin or nipple changes or axillary nodes  Abdomen:  normal findings: no organomegaly, soft, non-tender and no hernia  Pelvis:  External genitalia: normal general appearance Urinary system: urethral meatus normal and bladder without fullness, nontender Vaginal: normal without tenderness, induration or masses Cervix: normal appearance Adnexa: normal  bimanual exam Uterus: anteverted and non-tender, normal size   Lab Review Urine pregnancy test Labs reviewed yes Radiologic studies reviewed no  50% of 30 min visit spent on counseling and coordination of care.   Assessment:    Healthy female exam.   H/O of LEEP  Plan:    Education reviewed: calcium supplements, depression evaluation, low fat, low cholesterol diet, safe sex/STD prevention, self breast exams, skin cancer screening and weight bearing exercise. Contraception: none. Follow up in: 1 year.   No orders of the defined types were placed in this encounter.   Orders Placed This Encounter  Procedures  . Hepatitis C antibody  . Hepatitis B surface antigen  . RPR  . HIV antibody  . POCT urinalysis dipstick

## 2015-09-23 LAB — RPR: RPR Ser Ql: NONREACTIVE

## 2015-09-23 LAB — HEPATITIS C ANTIBODY: HEP C VIRUS AB: 0.1 {s_co_ratio} (ref 0.0–0.9)

## 2015-09-23 LAB — HEPATITIS B SURFACE ANTIGEN: HEP B S AG: NEGATIVE

## 2015-09-23 LAB — HIV ANTIBODY (ROUTINE TESTING W REFLEX): HIV SCREEN 4TH GENERATION: NONREACTIVE

## 2015-09-24 LAB — PAP IG W/ RFLX HPV ASCU: PAP Smear Comment: 0

## 2015-09-25 LAB — NUSWAB VG+, CANDIDA 6SP
CANDIDA KRUSEI, NAA: NEGATIVE
CANDIDA TROPICALIS, NAA: NEGATIVE
CHLAMYDIA TRACHOMATIS, NAA: NEGATIVE
Candida albicans, NAA: NEGATIVE
Candida glabrata, NAA: NEGATIVE
Candida lusitaniae, NAA: NEGATIVE
Candida parapsilosis, NAA: NEGATIVE
NEISSERIA GONORRHOEAE, NAA: NEGATIVE
TRICH VAG BY NAA: NEGATIVE

## 2016-09-07 ENCOUNTER — Emergency Department (HOSPITAL_COMMUNITY)
Admission: EM | Admit: 2016-09-07 | Discharge: 2016-09-07 | Disposition: A | Discharge status: Home / Self Care | Payer: Medicaid Other | Attending: Emergency Medicine | Admitting: Emergency Medicine

## 2016-09-07 ENCOUNTER — Encounter (HOSPITAL_COMMUNITY): Payer: Self-pay | Admitting: Emergency Medicine

## 2016-09-07 DIAGNOSIS — M545 Low back pain, unspecified: Secondary | ICD-10-CM

## 2016-09-07 DIAGNOSIS — Z79899 Other long term (current) drug therapy: Secondary | ICD-10-CM | POA: Insufficient documentation

## 2016-09-07 MED ORDER — METHOCARBAMOL 500 MG PO TABS: 500.0000 mg | ORAL_TABLET | Freq: Four times a day (QID) | ORAL | 0 refills | Status: DC | PRN

## 2016-09-07 MED ORDER — NAPROXEN 500 MG PO TABS: 500.0000 mg | ORAL_TABLET | Freq: Two times a day (BID) | ORAL | 0 refills | Status: DC | PRN

## 2016-09-07 NOTE — Discharge Instructions (Signed)
Read the information below.  Use the prescribed medication as directed.  Please discuss all new medications with your pharmacist.  You may return to the Emergency Department at any time for worsening condition or any new symptoms that concern you.    If you develop fevers, loss of control of bowel or bladder, weakness or numbness in your legs, or are unable to walk, return to the ER for a recheck.  °

## 2016-09-07 NOTE — ED Provider Notes (Signed)
[facility]-EMERGENCY DEPT Provider Note   CSN: [id number] Arrival date & time: 09/07/16  1108  By signing my name below, I, Cynda Acres, attest that this documentation has been prepared under the direction and in the presence of [facility], PA-C. Electronically Signed: Cynda Acres, Scribe. 09/07/16. 12:50 PM.  History   Chief Complaint Chief Complaint  Patient presents with  . Back Pain   HPI Comments: Jodi Ryan is a 26 y.o. female with no pertinent past medical history, who presents to the Emergency Department complaining of sudden-onset, constant lower back pain that began two days ago. Patient states her back pain has gradually worsened.  Patient describes her pain as dull/aching with a severity of 8/10. Patient states her pain is worse when sitting or changing positions. Patient reports sitting for long periods of time with bad posture, also started school recently. Patient denies any injury, falls, numbness, or weakness. Patient is ambulatory in the emergency department. Patient denies any cancer, IV drug use, loss of bowel./bladder control, nausea, vomiting, fever, chills, vaginal discharge, vaginal bleeding, dysuria, urinary frequency or urgency, bowel changes, or abdominal pain. LNMP was 08/22/16.  Denies possibility of pregnancy.  The history is provided by the patient. No language interpreter was used.    Past Medical History:  Diagnosis Date  . Abnormal Pap smear 2010   had LEEP  . Anemia   . Chlamydia     Patient Active Problem List   Diagnosis Date Noted  . History of loop electrical excision procedure (LEEP) 01/21/2013  . Well woman exam with routine gynecological exam 01/21/2013  . Vaginal delivery 08/22/2011    Past Surgical History:  Procedure Laterality Date  . LEEP      OB History    Gravida Para Term Preterm AB Living   2 2 2  0 0 2   SAB TAB Ectopic Multiple Live Births   0 0 0 0 2       Home Medications    Prior to Admission medications     Medication Sig Start Date End Date Taking? Authorizing Provider  acetaminophen (TYLENOL) 500 MG tablet Take 500 mg by mouth every 6 (six) hours as needed for mild pain.    Historical Provider, MD  methocarbamol (ROBAXIN) 500 MG tablet Take 1-2 tablets (500-1,000 mg total) by mouth every 6 (six) hours as needed for muscle spasms. 09/07/16   Trixie Dredge, PA-C  naproxen (NAPROSYN) 500 MG tablet Take 1 tablet (500 mg total) by mouth 2 (two) times daily as needed for mild pain or moderate pain. 09/07/16   Trixie Dredge, PA-C    Family History Family History  Problem Relation Age of Onset  . Hypertension Maternal Grandmother   . Diabetes Paternal Grandmother   . Hypotension Neg Hx   . Anesthesia problems Neg Hx   . Malignant hyperthermia Neg Hx   . Pseudochol deficiency Neg Hx     Social History Social History  Substance Use Topics  . Smoking status: Never Smoker  . Smokeless tobacco: Never Used  . Alcohol use Yes     Comment: occasionally      Allergies   Patient has no known allergies.   Review of Systems Review of Systems  Constitutional: Negative for chills and fever.  Gastrointestinal: Negative.  Negative for diarrhea, nausea and vomiting.  Genitourinary: Negative.   Musculoskeletal: Positive for back pain (lower). Negative for gait problem and joint swelling.  Allergic/Immunologic: Negative for immunocompromised state.  Neurological: Negative for weakness and numbness.  Psychiatric/Behavioral:  Negative for self-injury.     Physical Exam Updated Vital Signs BP 113/73 (BP Location: Right Arm)   Pulse (!) 103   Temp 98.1 F (36.7 C) (Oral)   Resp 18   Ht 5\' 6"  (1.676 m)   Wt 72.6 kg   LMP 08/22/2016   SpO2 99%   BMI 25.82 kg/m   Physical Exam  Constitutional: She appears well-developed and well-nourished. No distress.  HENT:  Head: Normocephalic and atraumatic.  Neck: Neck supple.  Pulmonary/Chest: Effort normal.  Abdominal: Soft. She exhibits no distension and  no mass. There is no tenderness. There is no rebound and no guarding.  Musculoskeletal:  Spine nontender, no crepitus, or stepoffs. Lower extremities:  Strength 5/5, sensation intact, distal pulses intact. Gait normal.   Neurological: She is alert.  Skin: She is not diaphoretic.  Nursing note and vitals reviewed.    ED Treatments / Results  DIAGNOSTIC STUDIES: Oxygen Saturation is 99% on RA, normal by my interpretation.    COORDINATION OF CARE: 12:49 PM Discussed treatment plan with pt at bedside and pt agreed to plan, which includes muscle relaxer and ibuprofen.   Labs (all labs ordered are listed, but only abnormal results are displayed) Labs Reviewed - No data to display  EKG  EKG Interpretation None       Radiology No results found.  Procedures Procedures (including critical care time)  Medications Ordered in ED Medications - No data to display   Initial Impression / Assessment and Plan / ED Course  I have reviewed the triage vital signs and the nursing notes.  Pertinent labs & imaging results that were available during my care of the patient were reviewed by me and considered in my medical decision making (see chart for details).     Afebrile, nontoxic patient with mechanical low back pain. No red flags.  Reproduced with certain movements and positions.  No bony tenderness.  D/C home with symptomatic medications, close PCP follow up if not improvement.  Discussed result, findings, treatment, and follow up  with patient.  Pt given return precautions.  Pt verbalizes understanding and agrees with plan.      Final Clinical Impressions(s) / ED Diagnoses   Final diagnoses:  Acute bilateral low back pain without sciatica    New Prescriptions Discharge Medication List as of 09/07/2016 12:34 PM    START taking these medications   Details  methocarbamol (ROBAXIN) 500 MG tablet Take 1-2 tablets (500-1,000 mg total) by mouth every 6 (six) hours as needed for muscle  spasms., Starting Thu 09/07/2016, Print    naproxen (NAPROSYN) 500 MG tablet Take 1 tablet (500 mg total) by mouth 2 (two) times daily as needed for mild pain or moderate pain., Starting Thu 09/07/2016, Print       I personally performed the services described in this documentation, which was scribed in my presence. The recorded information has been reviewed and is accurate.     Trixie Dredgemily Aolanis Crispen, PA-C 09/07/16 1342    Nira Connardama, Pedro Eduardo, MD 09/08/16 [id number]

## 2016-09-07 NOTE — ED Triage Notes (Signed)
Patient is complaining of lower back pain x2 days. Denies any urinary symptoms or injury/trauma to the area. Patient ambulatory to triage.

## 2016-09-25 ENCOUNTER — Ambulatory Visit: Payer: Self-pay | Admitting: Obstetrics

## 2016-09-26 ENCOUNTER — Ambulatory Visit: Payer: Medicaid Other | Admitting: Obstetrics

## 2016-10-19 ENCOUNTER — Other Ambulatory Visit (HOSPITAL_COMMUNITY)
Admission: RE | Admit: 2016-10-19 | Discharge: 2016-10-19 | Disposition: A | Discharge status: Home / Self Care | Payer: Medicaid Other | Source: Ambulatory Visit | Attending: Obstetrics | Admitting: Obstetrics

## 2016-10-19 ENCOUNTER — Ambulatory Visit (INDEPENDENT_AMBULATORY_CARE_PROVIDER_SITE_OTHER): Payer: Medicaid Other | Admitting: Obstetrics

## 2016-10-19 ENCOUNTER — Encounter: Payer: Self-pay | Admitting: Obstetrics

## 2016-10-19 VITALS — BP 125/74 | HR 93 | Wt 160.6 lb

## 2016-10-19 DIAGNOSIS — Z01419 Encounter for gynecological examination (general) (routine) without abnormal findings: Secondary | ICD-10-CM | POA: Insufficient documentation

## 2016-10-19 DIAGNOSIS — Z0000 Encounter for general adult medical examination without abnormal findings: Secondary | ICD-10-CM

## 2016-10-19 NOTE — Progress Notes (Signed)
Subjective:        Jodi Ryan is a 26 y.o. female here for a routine exam.  Current complaints: None.    Personal health questionnaire:  Is patient [name], have a family history of breast and/or ovarian cancer: no Is there a family history of uterine cancer diagnosed at age < 3150, gastrointestinal cancer, urinary tract cancer, family member who is a Personnel officerLynch syndrome-associated carrier: no Is the patient overweight and hypertensive, family history of diabetes, personal history of gestational diabetes, preeclampsia or PCOS: no Is patient over 6855, have PCOS,  family history of premature CHD under age 28, diabetes, smoke, have hypertension or peripheral artery disease:  no At any time, has a partner hit, kicked or otherwise hurt or frightened you?: no Over the past 2 weeks, have you felt down, depressed or hopeless?: no Over the past 2 weeks, have you felt little interest or pleasure in doing things?:no   Gynecologic History Patient's last menstrual period was 09/28/2016 (exact date). Contraception: abstinence Last Pap: 2017. Results were: normal Last mammogram: n/a. Results were: n/a  Obstetric History OB History  Gravida Para Term Preterm AB Living  2 2 2  0 0 2  SAB TAB Ectopic Multiple Live Births  0 0 0 0 2    # Outcome Date GA Lbr Len/2nd Weight Sex Delivery Anes PTL Lv  2 Term 08/22/11 3160w0d 09:02 / 00:25 8 lb 9.4 oz (3.895 kg) M Vag-Spont EPI  LIV     Birth Comments: WNL  1 Term 04/2006 3160w0d 20:00 8 lb 11 oz (3.941 kg) M Vag-Spont EPI N LIV     Birth Comments: anemia      Past Medical History:  Diagnosis Date  . Abnormal Pap smear 2010   had LEEP  . Anemia   . Chlamydia     Past Surgical History:  Procedure Laterality Date  . LEEP       Current Outpatient Prescriptions:  .  acetaminophen (TYLENOL) 500 MG tablet, Take 500 mg by mouth every 6 (six) hours as needed for mild pain., Disp: , Rfl:  .  Multiple Vitamins-Minerals (MULTIVITAMIN WITH  MINERALS) tablet, Take 1 tablet by mouth daily., Disp: , Rfl:  .  naproxen (NAPROSYN) 500 MG tablet, Take 1 tablet (500 mg total) by mouth 2 (two) times daily as needed for mild pain or moderate pain., Disp: 30 tablet, Rfl: 0 No Known Allergies  Social History  Substance Use Topics  . Smoking status: Never Smoker  . Smokeless tobacco: Never Used  . Alcohol use Yes     Comment: occasionally     Family History  Problem Relation Age of Onset  . Hypertension Maternal Grandmother   . Diabetes Paternal Grandmother   . Hypotension Neg Hx   . Anesthesia problems Neg Hx   . Malignant hyperthermia Neg Hx   . Pseudochol deficiency Neg Hx       Review of Systems  Constitutional: negative for fatigue and weight loss Respiratory: negative for cough and wheezing Cardiovascular: negative for chest pain, fatigue and palpitations Gastrointestinal: negative for abdominal pain and change in bowel habits Musculoskeletal:negative for myalgias Neurological: negative for gait problems and tremors Behavioral/Psych: negative for abusive relationship, depression Endocrine: negative for temperature intolerance    Genitourinary:negative for abnormal menstrual periods, genital lesions, hot flashes, sexual problems and vaginal discharge Integument/breast: negative for breast lump, breast tenderness, nipple discharge and skin lesion(s)    Objective:       BP 125/74   Pulse  93   Wt 160 lb 9.6 oz (72.8 kg)   LMP 09/28/2016 (Exact Date)   BMI 25.92 kg/m  General:   alert  Skin:   no rash or abnormalities  Lungs:   clear to auscultation bilaterally  Heart:   regular rate and rhythm, S1, S2 normal, no murmur, click, rub or gallop  Breasts:   normal without suspicious masses, skin or nipple changes or axillary nodes  Abdomen:  normal findings: no organomegaly, soft, non-tender and no hernia  Pelvis:  External genitalia: normal general appearance Urinary system: urethral meatus normal and bladder without  fullness, nontender Vaginal: normal without tenderness, induration or masses Cervix: normal appearance Adnexa: normal bimanual exam Uterus: anteverted and non-tender, normal size   Lab Review Urine pregnancy test Labs reviewed yes Radiologic studies reviewed no  50% of 20 min visit spent on counseling and coordination of care.    Assessment:    Healthy female exam.    Plan:    Education reviewed: calcium supplements, depression evaluation, low fat, low cholesterol diet, safe sex/STD prevention, self breast exams and weight bearing exercise. Contraception: abstinence. Follow up in: 1 year.   Meds ordered this encounter  Medications  . Multiple Vitamins-Minerals (MULTIVITAMIN WITH MINERALS) tablet    Sig: Take 1 tablet by mouth daily.   No orders of the defined types were placed in this encounter.      Patient ID: Jodi Ryan, female   DOB: 1990-06-30, 26 y.o.   MRN: [id number]

## 2016-10-19 NOTE — Progress Notes (Signed)
Patient is in the office for annual exam. Patient is dong well with no problems reported today.

## 2016-10-20 LAB — CERVICOVAGINAL ANCILLARY ONLY
Bacterial vaginitis: NEGATIVE
CANDIDA VAGINITIS: NEGATIVE
Chlamydia: NEGATIVE
Neisseria Gonorrhea: NEGATIVE
TRICH (WINDOWPATH): NEGATIVE

## 2016-10-23 LAB — CYTOLOGY - PAP: DIAGNOSIS: NEGATIVE

## 2018-01-10 ENCOUNTER — Ambulatory Visit: Payer: Medicaid Other | Admitting: Obstetrics

## 2018-01-10 ENCOUNTER — Encounter: Payer: Self-pay | Admitting: Obstetrics

## 2018-01-10 ENCOUNTER — Other Ambulatory Visit (HOSPITAL_COMMUNITY)
Admission: RE | Admit: 2018-01-10 | Discharge: 2018-01-10 | Disposition: A | Discharge status: Home / Self Care | Payer: Medicaid Other | Source: Ambulatory Visit | Attending: Obstetrics | Admitting: Obstetrics

## 2018-01-10 VITALS — BP 120/73 | HR 73 | Wt 161.8 lb

## 2018-01-10 DIAGNOSIS — Z01419 Encounter for gynecological examination (general) (routine) without abnormal findings: Secondary | ICD-10-CM

## 2018-01-10 DIAGNOSIS — Z0000 Encounter for general adult medical examination without abnormal findings: Secondary | ICD-10-CM | POA: Diagnosis not present

## 2018-01-10 NOTE — Progress Notes (Signed)
Pt is here for annual exam. Pt needs pap today, Pt also requesting STD testing. Pt is not using anything for contraception, she states she is not interested in getting anything for birth control at this time.

## 2018-01-10 NOTE — Progress Notes (Signed)
Subjective:        Jodi Ryan is a 27 y.o. female here for a routine exam.  Current complaints: None.    Personal health questionnaire:  Is patient [name], have a family history of breast and/or ovarian cancer: yes, Breast CA in mGM Is there a family history of uterine cancer diagnosed at age < 65, gastrointestinal cancer, urinary tract cancer, family member who is a Personnel officer syndrome-associated carrier: no Is the patient overweight and hypertensive, family history of diabetes, personal history of gestational diabetes, preeclampsia or PCOS: no Is patient over 46, have PCOS,  family history of premature CHD under age 46, diabetes, smoke, have hypertension or peripheral artery disease:  no At any time, has a partner hit, kicked or otherwise hurt or frightened you?: no Over the past 2 weeks, have you felt down, depressed or hopeless?: no Over the past 2 weeks, have you felt little interest or pleasure in doing things?:no   Gynecologic History Patient's last menstrual period was 12/17/2017 (exact date). Contraception: none Last Pap: 2018. Results were: normal Last mammogram: n/a. Results were: n/a  Obstetric History OB History  Gravida Para Term Preterm AB Living  2 2 2  0 0 2  SAB TAB Ectopic Multiple Live Births  0 0 0 0 2    # Outcome Date GA Lbr Len/2nd Weight Sex Delivery Anes PTL Lv  2 Term 08/22/11 [redacted]w[redacted]d 09:02 / 00:25 8 lb 9.4 oz (3.895 kg) M Vag-Spont EPI  LIV     Birth Comments: WNL  1 Term 04/2006 [redacted]w[redacted]d 20:00 8 lb 11 oz (3.941 kg) M Vag-Spont EPI N LIV     Birth Comments: anemia    Past Medical History:  Diagnosis Date  . Abnormal Pap smear 2010   had LEEP  . Anemia   . Chlamydia     Past Surgical History:  Procedure Laterality Date  . LEEP       Current Outpatient Medications:  .  acetaminophen (TYLENOL) 500 MG tablet, Take 500 mg by mouth every 6 (six) hours as needed for mild pain., Disp: , Rfl:  .  Multiple Vitamins-Minerals (MULTIVITAMIN  WITH MINERALS) tablet, Take 1 tablet by mouth daily., Disp: , Rfl:  .  naproxen (NAPROSYN) 500 MG tablet, Take 1 tablet (500 mg total) by mouth 2 (two) times daily as needed for mild pain or moderate pain., Disp: 30 tablet, Rfl: 0 No Known Allergies  Social History   Tobacco Use  . Smoking status: Never Smoker  . Smokeless tobacco: Never Used  Substance Use Topics  . Alcohol use: Yes    Comment: occasionally     Family History  Problem Relation Age of Onset  . Hypertension Maternal Grandmother   . Diabetes Paternal Grandmother   . Hypotension Neg Hx   . Anesthesia problems Neg Hx   . Malignant hyperthermia Neg Hx   . Pseudochol deficiency Neg Hx       Review of Systems  Constitutional: negative for fatigue and weight loss Respiratory: negative for cough and wheezing Cardiovascular: negative for chest pain, fatigue and palpitations Gastrointestinal: negative for abdominal pain and change in bowel habits Musculoskeletal:negative for myalgias Neurological: negative for gait problems and tremors Behavioral/Psych: negative for abusive relationship, depression Endocrine: negative for temperature intolerance    Genitourinary:negative for abnormal menstrual periods, genital lesions, hot flashes, sexual problems and vaginal discharge Integument/breast: negative for breast lump, breast tenderness, nipple discharge and skin lesion(s)    Objective:  BP 120/73   Pulse 73   Wt 161 lb 12.8 oz (73.4 kg)   LMP 12/17/2017 (Exact Date)   BMI 26.12 kg/m  General:   alert  Skin:   no rash or abnormalities  Lungs:   clear to auscultation bilaterally  Heart:   regular rate and rhythm, S1, S2 normal, no murmur, click, rub or gallop  Breasts:   normal without suspicious masses, skin or nipple changes or axillary nodes  Abdomen:  normal findings: no organomegaly, soft, non-tender and no hernia  Pelvis:  External genitalia: normal general appearance Urinary system: urethral meatus  normal and bladder without fullness, nontender Vaginal: normal without tenderness, induration or masses Cervix: normal appearance Adnexa: normal bimanual exam Uterus: anteverted and non-tender, normal size   Lab Review Urine pregnancy test Labs reviewed yes Radiologic studies reviewed no  50% of 29 min visit spent on counseling and coordination of care.   Assessment:    Healthy female exam.    Plan:    Education reviewed: calcium supplements, depression evaluation, low fat, low cholesterol diet, safe sex/STD prevention, self breast exams and weight bearing exercise. Contraception: none. Follow up in: 1 year.   No orders of the defined types were placed in this encounter.  Orders Placed This Encounter  Procedures  . Hepatitis B surface antigen  . Hepatitis C antibody  . HIV antibody  . RPR    Brock Bad MD 01-10-2018

## 2018-01-11 LAB — CERVICOVAGINAL ANCILLARY ONLY
Bacterial vaginitis: NEGATIVE
CANDIDA VAGINITIS: NEGATIVE
Chlamydia: NEGATIVE
Neisseria Gonorrhea: NEGATIVE
Trichomonas: NEGATIVE

## 2018-01-11 LAB — CYTOLOGY - PAP: DIAGNOSIS: NEGATIVE

## 2018-01-11 LAB — RPR: RPR Ser Ql: NONREACTIVE

## 2018-01-11 LAB — HEPATITIS B SURFACE ANTIGEN: HEP B S AG: NEGATIVE

## 2018-01-11 LAB — HIV ANTIBODY (ROUTINE TESTING W REFLEX): HIV Screen 4th Generation wRfx: NONREACTIVE

## 2018-01-11 LAB — HEPATITIS C ANTIBODY: Hep C Virus Ab: <0.1 s/co ratio (ref 0.0–0.9)

## 2018-05-24 ENCOUNTER — Emergency Department (HOSPITAL_COMMUNITY)
Admission: EM | Admit: 2018-05-24 | Discharge: 2018-05-24 | Disposition: A | Discharge status: Home / Self Care | Payer: Medicaid Other | Attending: Emergency Medicine | Admitting: Emergency Medicine

## 2018-05-24 ENCOUNTER — Other Ambulatory Visit: Payer: Self-pay

## 2018-05-24 ENCOUNTER — Encounter (HOSPITAL_COMMUNITY): Payer: Self-pay

## 2018-05-24 DIAGNOSIS — R509 Fever, unspecified: Secondary | ICD-10-CM | POA: Diagnosis present

## 2018-05-24 DIAGNOSIS — Z79899 Other long term (current) drug therapy: Secondary | ICD-10-CM | POA: Diagnosis not present

## 2018-05-24 DIAGNOSIS — J111 Influenza due to unidentified influenza virus with other respiratory manifestations: Secondary | ICD-10-CM | POA: Diagnosis not present

## 2018-05-24 DIAGNOSIS — R6889 Other general symptoms and signs: Secondary | ICD-10-CM

## 2018-05-24 MED ORDER — OSELTAMIVIR PHOSPHATE 75 MG PO CAPS: 75.0000 mg | ORAL_CAPSULE | Freq: Two times a day (BID) | ORAL | 0 refills | Status: DC

## 2018-05-24 MED ORDER — HYDROCOD POLST-CPM POLST ER 10-8 MG/5ML PO SUER: 5.0000 mL | Freq: Every evening | ORAL | 0 refills | Status: DC | PRN

## 2018-05-24 NOTE — ED Triage Notes (Signed)
Pt reports that her roommate has the flu and now she is having chills, body aches, and coughing.

## 2018-05-24 NOTE — ED Provider Notes (Signed)
[facility]-EMERGENCY DEPT Provider Note   CSN: [id number] Arrival date & time: 05/24/18  2004     History   Chief Complaint Chief Complaint  Patient presents with  . flu like symptoms    HPI Jodi Ryan is a 28 y.o. female.  Patient here with symptoms of cough that started last night, that progressed to body aches, fever, congestion, and fatigue today. She reports her roommate has been diagnosed with the flu. No nausea or vomiting. No ruinary symptoms. She is otherwise healthy, nonsmoker. She is taking Theraflu for symptoms without significant relief.   The history is provided by the patient. No language interpreter was used.    Past Medical History:  Diagnosis Date  . Abnormal Pap smear 2010   had LEEP  . Anemia   . Chlamydia     Patient Active Problem List   Diagnosis Date Noted  . History of loop electrical excision procedure (LEEP) 01/21/2013  . Well woman exam with routine gynecological exam 01/21/2013  . Vaginal delivery 08/22/2011    Past Surgical History:  Procedure Laterality Date  . LEEP       OB History    Gravida  2   Para  2   Term  2   Preterm  0   AB  0   Living  2     SAB  0   TAB  0   Ectopic  0   Multiple  0   Live Births  2            Home Medications    Prior to Admission medications   Medication Sig Start Date End Date Taking? Authorizing Provider  acetaminophen (TYLENOL) 500 MG tablet Take 500 mg by mouth every 6 (six) hours as needed for mild pain.    [provider]  Multiple Vitamins-Minerals (MULTIVITAMIN WITH MINERALS) tablet Take 1 tablet by mouth daily.    [provider]  naproxen (NAPROSYN) 500 MG tablet Take 1 tablet (500 mg total) by mouth 2 (two) times daily as needed for mild pain or moderate pain. 09/07/16   Trixie DredgeWest, Emily, PA-C    Family History Family History  Problem Relation Age of Onset  . Hypertension Maternal Grandmother   . Diabetes Paternal  Grandmother   . Hypotension Neg Hx   . Anesthesia problems Neg Hx   . Malignant hyperthermia Neg Hx   . Pseudochol deficiency Neg Hx     Social History Social History   Tobacco Use  . Smoking status: Never Smoker  . Smokeless tobacco: Never Used  Substance Use Topics  . Alcohol use: Yes    Comment: occasionally   . Drug use: No     Allergies   Patient has no known allergies.   Review of Systems Review of Systems  Constitutional: Positive for chills, fatigue and fever.  HENT: Positive for congestion and ear pain.   Respiratory: Positive for cough. Negative for shortness of breath and wheezing.   Cardiovascular: Negative.   Gastrointestinal: Negative.  Negative for nausea and vomiting.  Musculoskeletal: Positive for myalgias. Negative for neck stiffness.  Skin: Negative.   Neurological: Negative.      Physical Exam Updated Vital Signs BP 111/80 (BP Location: Left Arm)   Pulse 99   Temp 100.3 F (37.9 C)   Resp 16   Wt 76.2 kg   LMP 05/12/2018   SpO2 100%   BMI 27.12 kg/m   Physical Exam Vitals signs and nursing note  reviewed.  Constitutional:      Appearance: She is well-developed. She is not ill-appearing.  HENT:     Head: Normocephalic.     Right Ear: Tympanic membrane normal.     Left Ear: Tympanic membrane normal.     Nose: Congestion present.     Mouth/Throat:     Mouth: Mucous membranes are moist.     Pharynx: Oropharynx is clear.  Eyes:     Conjunctiva/sclera: Conjunctivae normal.  Neck:     Musculoskeletal: Normal range of motion and neck supple.  Cardiovascular:     Rate and Rhythm: Normal rate and regular rhythm.  Pulmonary:     Effort: Pulmonary effort is normal.     Breath sounds: Normal breath sounds. No wheezing, rhonchi or rales.  Musculoskeletal: Normal range of motion.  Skin:    General: Skin is warm and dry.     Findings: No rash.  Neurological:     General: No focal deficit present.     Mental Status: She is alert and  oriented to person, place, and time.      ED Treatments / Results  Labs (all labs ordered are listed, but only abnormal results are displayed) Labs Reviewed - No data to display  EKG None  Radiology No results found.  Procedures Procedures (including critical care time)  Medications Ordered in ED Medications - No data to display   Initial Impression / Assessment and Plan / ED Course  I have reviewed the triage vital signs and the nursing notes.  Pertinent labs & imaging results that were available during my care of the patient were reviewed by me and considered in my medical decision making (see chart for details).     Patient ot ED with 24 hours of URI symptoms with cough, fever and muscle aches. Roommate with the flu.  Patient counseled on Tamiflu, side effects and she opts to get a prescription. Recommended other supportive care measures. Will provide Tussionex for night time use.   Final Clinical Impressions(s) / ED Diagnoses   Final diagnoses:  None   1. Flu like illness  ED Discharge Orders    None       Elpidio AnisUpstill, Hydee Fleece, Cordelia Poche-C 05/24/18 2234    Virgina Norfolkuratolo, Adam, DO 05/25/18 0125

## 2019-08-06 DIAGNOSIS — M5431 Sciatica, right side: Secondary | ICD-10-CM | POA: Diagnosis not present

## 2019-08-06 DIAGNOSIS — Z0000 Encounter for general adult medical examination without abnormal findings: Secondary | ICD-10-CM | POA: Diagnosis not present

## 2019-08-06 DIAGNOSIS — E663 Overweight: Secondary | ICD-10-CM | POA: Diagnosis not present

## 2019-08-27 ENCOUNTER — Encounter: Payer: Self-pay | Admitting: Obstetrics

## 2019-08-27 ENCOUNTER — Other Ambulatory Visit: Payer: Self-pay

## 2019-08-27 ENCOUNTER — Ambulatory Visit: Payer: Medicaid Other | Admitting: Obstetrics

## 2019-08-27 ENCOUNTER — Other Ambulatory Visit (HOSPITAL_COMMUNITY)
Admission: RE | Admit: 2019-08-27 | Discharge: 2019-08-27 | Disposition: A | Discharge status: Home / Self Care | Payer: Medicaid Other | Source: Ambulatory Visit | Attending: Obstetrics | Admitting: Obstetrics

## 2019-08-27 VITALS — BP 142/78 | HR 85 | Ht 66.0 in | Wt 165.0 lb

## 2019-08-27 DIAGNOSIS — Z0001 Encounter for general adult medical examination with abnormal findings: Secondary | ICD-10-CM | POA: Diagnosis not present

## 2019-08-27 DIAGNOSIS — Z01419 Encounter for gynecological examination (general) (routine) without abnormal findings: Secondary | ICD-10-CM

## 2019-08-27 DIAGNOSIS — Z3009 Encounter for other general counseling and advice on contraception: Secondary | ICD-10-CM

## 2019-08-27 DIAGNOSIS — N898 Other specified noninflammatory disorders of vagina: Secondary | ICD-10-CM | POA: Diagnosis not present

## 2019-08-27 DIAGNOSIS — Z113 Encounter for screening for infections with a predominantly sexual mode of transmission: Secondary | ICD-10-CM | POA: Diagnosis not present

## 2019-08-27 NOTE — Progress Notes (Signed)
Subjective:        Jodi Ryan is a 29 y.o. female here for a routine exam.  Current complaints: None.    Personal health questionnaire:  Is patient Jodi Ryan, have a family history of breast and/or ovarian cancer: yes Is there a family history of uterine cancer diagnosed at age < 38, gastrointestinal cancer, urinary tract cancer, family member who is a Field seismologist syndrome-associated carrier: no Is the patient overweight and hypertensive, family history of diabetes, personal history of gestational diabetes, preeclampsia or PCOS: no Is patient over 36, have PCOS,  family history of premature CHD under age 38, diabetes, smoke, have hypertension or peripheral artery disease:  no At any time, has a partner hit, kicked or otherwise hurt or frightened you?: no Over the past 2 weeks, have you felt down, depressed or hopeless?: no Over the past 2 weeks, have you felt little interest or pleasure in doing things?:no   Gynecologic History Patient's last menstrual period was 08/06/2019. Contraception: none Last Pap: 01-10-2018. Results were: normal Last mammogram: n/a. Results were: n/a  Obstetric History OB History  Gravida Para Term Preterm AB Living  2 2 2  0 0 2  SAB TAB Ectopic Multiple Live Births  0 0 0 0 2    # Outcome Date GA Lbr Len/2nd Weight Sex Delivery Anes PTL Lv  2 Term 08/22/11 [redacted]w[redacted]d 09:02 / 00:25 8 lb 9.4 oz (3.895 kg) M Vag-Spont EPI  LIV     Birth Comments: WNL  1 Term 04/2006 [redacted]w[redacted]d 20:00 8 lb 11 oz (3.941 kg) M Vag-Spont EPI N LIV     Birth Comments: anemia    Past Medical History:  Diagnosis Date  . Abnormal Pap smear 2010   had LEEP  . Anemia   . Chlamydia     Past Surgical History:  Procedure Laterality Date  . LEEP       Current Outpatient Medications:  Marland Kitchen  Multiple Vitamins-Minerals (MULTIVITAMIN WITH MINERALS) tablet, Take 1 tablet by mouth daily., Disp: , Rfl:  .  acetaminophen (TYLENOL) 500 MG tablet, Take 500 mg by mouth every 6 (six) hours  as needed for mild pain., Disp: , Rfl:  .  chlorpheniramine-HYDROcodone (TUSSIONEX PENNKINETIC ER) 10-8 MG/5ML SUER, Take 5 mLs by mouth at bedtime as needed for cough. (Patient not taking: Reported on 08/27/2019), Disp: 70 mL, Rfl: 0 .  naproxen (NAPROSYN) 500 MG tablet, Take 1 tablet (500 mg total) by mouth 2 (two) times daily as needed for mild pain or moderate pain. (Patient not taking: Reported on 08/27/2019), Disp: 30 tablet, Rfl: 0 .  oseltamivir (TAMIFLU) 75 MG capsule, Take 1 capsule (75 mg total) by mouth every 12 (twelve) hours. (Patient not taking: Reported on 08/27/2019), Disp: 10 capsule, Rfl: 0 No Known Allergies  Social History   Tobacco Use  . Smoking status: Never Smoker  . Smokeless tobacco: Never Used  Substance Use Topics  . Alcohol use: Yes    Comment: occasionally     Family History  Problem Relation Age of Onset  . Hypertension Maternal Grandmother   . Diabetes Paternal Grandmother   . Breast cancer Paternal Grandmother   . Hypotension Neg Hx   . Anesthesia problems Neg Hx   . Malignant hyperthermia Neg Hx   . Pseudochol deficiency Neg Hx       Review of Systems  Constitutional: negative for fatigue and weight loss Respiratory: negative for cough and wheezing Cardiovascular: negative for chest pain, fatigue and palpitations Gastrointestinal: negative  for abdominal pain and change in bowel habits Musculoskeletal:negative for myalgias Neurological: negative for gait problems and tremors Behavioral/Psych: negative for abusive relationship, depression Endocrine: negative for temperature intolerance    Genitourinary:negative for abnormal menstrual periods, genital lesions, hot flashes, sexual problems and vaginal discharge Integument/breast: negative for breast lump, breast tenderness, nipple discharge and skin lesion(s)    Objective:       BP (!) 142/78   Pulse 85   Ht 5\' 6"  (1.676 m)   Wt 165 lb (74.8 kg)   LMP 08/06/2019   BMI 26.63 kg/m  General:    alert  Skin:   no rash or abnormalities  Lungs:   clear to auscultation bilaterally  Heart:   regular rate and rhythm, S1, S2 normal, no murmur, click, rub or gallop  Breasts:   normal without suspicious masses, skin or nipple changes or axillary nodes  Abdomen:  normal findings: no organomegaly, soft, non-tender and no hernia  Pelvis:  External genitalia: normal general appearance Urinary system: urethral meatus normal and bladder without fullness, nontender Vaginal: normal without tenderness, induration or masses Cervix: normal appearance Adnexa: normal bimanual exam Uterus: anteverted and non-tender, normal size   Lab Review Urine pregnancy test Labs reviewed yes Radiologic studies reviewed no  50% of 25 min visit spent on counseling and coordination of care.   Assessment:     1. Encounter for gynecological examination with Papanicolaou smear of cervix Rx: - Cytology - PAP( [facility])  2. Vaginal discharge Rx: - Cervicovaginal ancillary only( [place])  3. Screen for STD (sexually transmitted disease) Rx: - HIV antibody (with reflex) - RPR - Hepatitis C Antibody - Hepatitis B Surface AntiGEN  4. Encounter for other general counseling or advice on contraception - declines contraception   Plan:    Education reviewed: calcium supplements, depression evaluation, low fat, low cholesterol diet, safe sex/STD prevention, self breast exams and weight bearing exercise. Contraception: abstinence. Follow up in: 1 year.   No orders of the defined types were placed in this encounter.  Orders Placed This Encounter  Procedures  . HIV antibody (with reflex)  . RPR  . Hepatitis C Antibody  . Hepatitis B Surface AntiGEN    08/08/2019, MD 08/27/2019 10:44 AM

## 2019-08-27 NOTE — Progress Notes (Signed)
Pt is in the office for annual. Last pap 01-10-18 GAD-7= 6 Pt declines birth control. LMP 08-06-19

## 2019-08-28 ENCOUNTER — Other Ambulatory Visit: Payer: Self-pay | Admitting: Obstetrics

## 2019-08-28 DIAGNOSIS — B373 Candidiasis of vulva and vagina: Secondary | ICD-10-CM

## 2019-08-28 DIAGNOSIS — B3731 Acute candidiasis of vulva and vagina: Secondary | ICD-10-CM

## 2019-08-28 LAB — CERVICOVAGINAL ANCILLARY ONLY
Bacterial Vaginitis (gardnerella): NEGATIVE
Candida Glabrata: NEGATIVE
Candida Vaginitis: POSITIVE — AB
Chlamydia: NEGATIVE
Comment: NEGATIVE
Comment: NEGATIVE
Comment: NEGATIVE
Comment: NEGATIVE
Comment: NEGATIVE
Comment: NORMAL
Neisseria Gonorrhea: NEGATIVE
Trichomonas: NEGATIVE

## 2019-08-28 LAB — CYTOLOGY - PAP
Comment: NEGATIVE
Diagnosis: NEGATIVE
High risk HPV: NEGATIVE

## 2019-08-28 LAB — HEPATITIS B SURFACE ANTIGEN: Hepatitis B Surface Ag: NEGATIVE

## 2019-08-28 LAB — HIV ANTIBODY (ROUTINE TESTING W REFLEX): HIV Screen 4th Generation wRfx: NONREACTIVE

## 2019-08-28 LAB — HEPATITIS C ANTIBODY: Hep C Virus Ab: <0.1 s/co ratio (ref 0.0–0.9)

## 2019-08-28 LAB — RPR: RPR Ser Ql: NONREACTIVE

## 2019-08-28 MED ORDER — FLUCONAZOLE 150 MG PO TABS: 150.0000 mg | ORAL_TABLET | Freq: Once | ORAL | 0 refills | Status: AC

## 2019-10-17 DIAGNOSIS — M5431 Sciatica, right side: Secondary | ICD-10-CM | POA: Diagnosis not present

## 2020-09-13 DIAGNOSIS — U071 COVID-19: Secondary | ICD-10-CM | POA: Diagnosis not present

## 2021-02-15 ENCOUNTER — Ambulatory Visit (INDEPENDENT_AMBULATORY_CARE_PROVIDER_SITE_OTHER): Payer: Medicaid Other | Admitting: Obstetrics

## 2021-02-15 ENCOUNTER — Other Ambulatory Visit: Payer: Self-pay

## 2021-02-15 ENCOUNTER — Other Ambulatory Visit (HOSPITAL_COMMUNITY)
Admission: RE | Admit: 2021-02-15 | Discharge: 2021-02-15 | Disposition: A | Discharge status: Home / Self Care | Payer: Medicaid Other | Source: Ambulatory Visit | Attending: Obstetrics | Admitting: Obstetrics

## 2021-02-15 ENCOUNTER — Encounter: Payer: Self-pay | Admitting: Obstetrics

## 2021-02-15 VITALS — BP 119/70 | HR 80 | Ht 66.0 in | Wt 159.0 lb

## 2021-02-15 DIAGNOSIS — N898 Other specified noninflammatory disorders of vagina: Secondary | ICD-10-CM | POA: Insufficient documentation

## 2021-02-15 DIAGNOSIS — Z3009 Encounter for other general counseling and advice on contraception: Secondary | ICD-10-CM | POA: Diagnosis not present

## 2021-02-15 DIAGNOSIS — Z113 Encounter for screening for infections with a predominantly sexual mode of transmission: Secondary | ICD-10-CM

## 2021-02-15 DIAGNOSIS — Z01419 Encounter for gynecological examination (general) (routine) without abnormal findings: Secondary | ICD-10-CM | POA: Insufficient documentation

## 2021-02-15 NOTE — Progress Notes (Signed)
Subjective:        Jodi Ryan is a 30 y.o. female here for a routine exam.  Current complaints: Vaginal discharge  Personal health questionnaire:  Is patient Jodi Ryan, have a family history of breast and/or ovarian cancer: no Is there a family history of uterine cancer diagnosed at age < 1, gastrointestinal cancer, urinary tract cancer, family member who is a Personnel officer syndrome-associated carrier: no Is the patient overweight and hypertensive, family history of diabetes, personal history of gestational diabetes, preeclampsia or PCOS: no Is patient over 21, have PCOS,  family history of premature CHD under age 74, diabetes, smoke, have hypertension or peripheral artery disease:  no At any time, has a partner hit, kicked or otherwise hurt or frightened you?: no Over the past 2 weeks, have you felt down, depressed or hopeless?: no Over the past 2 weeks, have you felt little interest or pleasure in doing things?:no   Gynecologic History Patient's last menstrual period was 02/01/2021. Contraception: none Last Pap: 08-27-2019. Results were: normal Last mammogram: n/a. Results were: n/a  Obstetric History OB History  Gravida Para Term Preterm AB Living  2 2 2  0 0 2  SAB IAB Ectopic Multiple Live Births  0 0 0 0 2    # Outcome Date GA Lbr Len/2nd Weight Sex Delivery Anes PTL Lv  2 Term 08/22/11 [redacted]w[redacted]d 09:02 / 00:25 8 lb 9.4 oz (3.895 kg) M Vag-Spont EPI  LIV     Birth Comments: WNL  1 Term 04/2006 [redacted]w[redacted]d 20:00 8 lb 11 oz (3.941 kg) M Vag-Spont EPI N LIV     Birth Comments: anemia    Past Medical History:  Diagnosis Date   Abnormal Pap smear 2010   had LEEP   Anemia    Chlamydia     Past Surgical History:  Procedure Laterality Date   LEEP       Current Outpatient Medications:    Multiple Vitamins-Minerals (MULTIVITAMIN WITH MINERALS) tablet, Take 1 tablet by mouth daily., Disp: , Rfl:    Omega-3 Fatty Acids (FISH OIL) 1000 MG CAPS, Take by mouth., Disp: , Rfl:     Probiotic Product (PROBIOTIC-10 PO), Take by mouth., Disp: , Rfl:    acetaminophen (TYLENOL) 500 MG tablet, Take 500 mg by mouth every 6 (six) hours as needed for mild pain., Disp: , Rfl:  No Known Allergies  Social History   Tobacco Use   Smoking status: Never   Smokeless tobacco: Never  Substance Use Topics   Alcohol use: Yes    Comment: occasionally     Family History  Problem Relation Age of Onset   Hypertension Maternal Grandmother    Diabetes Paternal Grandmother    Breast cancer Paternal Grandmother    Hypotension Neg Hx    Anesthesia problems Neg Hx    Malignant hyperthermia Neg Hx    Pseudochol deficiency Neg Hx       Review of Systems  Constitutional: negative for fatigue and weight loss Respiratory: negative for cough and wheezing Cardiovascular: negative for chest pain, fatigue and palpitations Gastrointestinal: negative for abdominal pain and change in bowel habits Musculoskeletal:negative for myalgias Neurological: negative for gait problems and tremors Behavioral/Psych: negative for abusive relationship, depression Endocrine: negative for temperature intolerance    Genitourinary:positive for vaginal discharge.  negative for abnormal menstrual periods, genital lesions, hot flashes, sexual problems Integument/breast: negative for breast lump, breast tenderness, nipple discharge and skin lesion(s)    Objective:       BP 119/70  Pulse 80   Ht 5\' 6"  (1.676 m)   Wt 159 lb (72.1 kg)   LMP 02/01/2021   BMI 25.66 kg/m  General:   Alert and no distress  Skin:   no rash or abnormalities  Lungs:   clear to auscultation bilaterally  Heart:   regular rate and rhythm, S1, S2 normal, no murmur, click, rub or gallop  Breasts:   normal without suspicious masses, skin or nipple changes or axillary nodes  Abdomen:  normal findings: no organomegaly, soft, non-tender and no hernia  Pelvis:  External genitalia: normal general appearance Urinary system: urethral meatus  normal and bladder without fullness, nontender Vaginal: normal without tenderness, induration or masses Cervix: normal appearance Adnexa: normal bimanual exam Uterus: anteverted and non-tender, normal size   Lab Review Urine pregnancy test Labs reviewed yes Radiologic studies reviewed no  I have spent a total of 20 minutes of face-to-face time, excluding clinical staff time, reviewing notes and preparing to see patient, ordering tests and/or medications, and counseling the patient.   Assessment:    1. Encounter for gynecological examination with Papanicolaou smear of cervix Rx: - Cytology - PAP( [place])  2. Vaginal discharge Rx: - Cervicovaginal ancillary only( [place])  3. Screening for STD (sexually transmitted disease) Rx: - HIV Antibody (routine testing w rflx) - Hepatitis B surface antigen - RPR - Hepatitis C antibody  4. Encounter for other general counseling and advice on contraception - declines contraception     Plan:    Education reviewed: calcium supplements, depression evaluation, low fat, low cholesterol diet, safe sex/STD prevention, self breast exams, and weight bearing exercise. Contraception: none. Follow up in: 1 year.     Orders Placed This Encounter  Procedures   HIV Antibody (routine testing w rflx)   Hepatitis B surface antigen   RPR   Hepatitis C antibody      02/03/2021, MD 02/15/2021 4:53 PM

## 2021-02-16 LAB — HEPATITIS C ANTIBODY: Hep C Virus Ab: <0.1 s/co ratio (ref 0.0–0.9)

## 2021-02-16 LAB — HIV ANTIBODY (ROUTINE TESTING W REFLEX): HIV Screen 4th Generation wRfx: NONREACTIVE

## 2021-02-16 LAB — HEPATITIS B SURFACE ANTIGEN: Hepatitis B Surface Ag: NEGATIVE

## 2021-02-16 LAB — RPR: RPR Ser Ql: NONREACTIVE

## 2021-02-17 LAB — CERVICOVAGINAL ANCILLARY ONLY
Bacterial Vaginitis (gardnerella): POSITIVE — AB
Candida Glabrata: NEGATIVE
Candida Vaginitis: NEGATIVE
Chlamydia: NEGATIVE
Comment: NEGATIVE
Comment: NEGATIVE
Comment: NEGATIVE
Comment: NEGATIVE
Comment: NEGATIVE
Comment: NORMAL
Neisseria Gonorrhea: NEGATIVE
Trichomonas: NEGATIVE

## 2021-02-18 ENCOUNTER — Other Ambulatory Visit: Payer: Self-pay | Admitting: Obstetrics

## 2021-02-18 DIAGNOSIS — B9689 Other specified bacterial agents as the cause of diseases classified elsewhere: Secondary | ICD-10-CM

## 2021-02-18 MED ORDER — METRONIDAZOLE 500 MG PO TABS: 500.0000 mg | ORAL_TABLET | Freq: Two times a day (BID) | ORAL | 2 refills | Status: AC

## 2021-02-21 LAB — CYTOLOGY - PAP
Comment: NEGATIVE
Diagnosis: NEGATIVE
Diagnosis: REACTIVE
High risk HPV: NEGATIVE

## 2023-03-06 ENCOUNTER — Telehealth: Payer: Self-pay

## 2023-03-06 NOTE — Telephone Encounter (Signed)
  Medicaid Managed Care   Unsuccessful Outreach Note  03/06/2023 Name: [name] MRN: [id number] DOB: Aug 24, 1990  Referred by: Macy Mis, MD Reason for referral : No chief complaint on file.   An unsuccessful telephone outreach was attempted today. The patient was referred to the case management team for assistance with care management and care coordination.   Follow Up Plan: If patient returns call to provider office, please advise to call Embedded Care Management Care Guide Nicholes Rough* at [phone]*  Nicholes Rough, CMA Care Guide [employer]

## 2023-05-25 DIAGNOSIS — M79645 Pain in left finger(s): Secondary | ICD-10-CM | POA: Diagnosis not present

## 2023-05-25 DIAGNOSIS — S6992XA Unspecified injury of left wrist, hand and finger(s), initial encounter: Secondary | ICD-10-CM | POA: Diagnosis not present

## 2023-05-31 DIAGNOSIS — S62637A Displaced fracture of distal phalanx of left little finger, initial encounter for closed fracture: Secondary | ICD-10-CM | POA: Diagnosis not present

## 2023-07-12 DIAGNOSIS — S62637D Displaced fracture of distal phalanx of left little finger, subsequent encounter for fracture with routine healing: Secondary | ICD-10-CM | POA: Diagnosis not present
